# Patient Record
Sex: Male | Born: 1960
Health system: Southern US, Community
[De-identification: ages and names within clinical notes are randomized; demographics above are authoritative.]

## PROBLEM LIST (undated history)

## (undated) DIAGNOSIS — N189 Chronic kidney disease, unspecified: Secondary | ICD-10-CM

## (undated) DIAGNOSIS — T7840XA Allergy, unspecified, initial encounter: Secondary | ICD-10-CM

## (undated) DIAGNOSIS — N529 Male erectile dysfunction, unspecified: Secondary | ICD-10-CM

## (undated) DIAGNOSIS — E785 Hyperlipidemia, unspecified: Secondary | ICD-10-CM

## (undated) DIAGNOSIS — F909 Attention-deficit hyperactivity disorder, unspecified type: Secondary | ICD-10-CM

## (undated) HISTORY — DX: Hyperlipidemia, unspecified: E78.5

## (undated) HISTORY — DX: Allergy, unspecified, initial encounter: T78.40XA

## (undated) HISTORY — DX: Chronic kidney disease, unspecified: N18.9

## (undated) HISTORY — PX: KIDNEY STONE SURGERY: SHX686

## (undated) HISTORY — DX: Male erectile dysfunction, unspecified: N52.9

## (undated) HISTORY — DX: Attention-deficit hyperactivity disorder, unspecified type: F90.9

---

## 2004-09-26 ENCOUNTER — Ambulatory Visit: Payer: Self-pay | Admitting: Family Medicine

## 2004-12-25 ENCOUNTER — Ambulatory Visit: Payer: Self-pay | Admitting: Family Medicine

## 2005-01-13 ENCOUNTER — Ambulatory Visit: Payer: Self-pay | Admitting: Licensed Clinical Social Worker

## 2005-01-20 ENCOUNTER — Ambulatory Visit: Payer: Self-pay | Admitting: Licensed Clinical Social Worker

## 2005-01-29 ENCOUNTER — Ambulatory Visit: Payer: Self-pay | Admitting: Licensed Clinical Social Worker

## 2005-02-11 ENCOUNTER — Ambulatory Visit: Payer: Self-pay | Admitting: Licensed Clinical Social Worker

## 2005-02-18 ENCOUNTER — Ambulatory Visit: Payer: Self-pay | Admitting: Licensed Clinical Social Worker

## 2005-02-24 ENCOUNTER — Ambulatory Visit: Payer: Self-pay | Admitting: Licensed Clinical Social Worker

## 2005-02-26 ENCOUNTER — Ambulatory Visit: Payer: Self-pay | Admitting: Licensed Clinical Social Worker

## 2005-03-26 ENCOUNTER — Ambulatory Visit: Payer: Self-pay | Admitting: Licensed Clinical Social Worker

## 2005-04-02 ENCOUNTER — Ambulatory Visit: Payer: Self-pay | Admitting: Licensed Clinical Social Worker

## 2005-04-15 ENCOUNTER — Ambulatory Visit: Payer: Self-pay | Admitting: Licensed Clinical Social Worker

## 2005-04-21 ENCOUNTER — Ambulatory Visit: Payer: Self-pay | Admitting: Licensed Clinical Social Worker

## 2005-04-28 ENCOUNTER — Ambulatory Visit: Payer: Self-pay | Admitting: Licensed Clinical Social Worker

## 2005-05-05 ENCOUNTER — Ambulatory Visit: Payer: Self-pay | Admitting: Licensed Clinical Social Worker

## 2005-05-12 ENCOUNTER — Ambulatory Visit: Payer: Self-pay | Admitting: Licensed Clinical Social Worker

## 2005-05-19 ENCOUNTER — Ambulatory Visit: Payer: Self-pay | Admitting: Licensed Clinical Social Worker

## 2005-09-17 ENCOUNTER — Ambulatory Visit: Payer: Self-pay | Admitting: Family Medicine

## 2005-09-22 ENCOUNTER — Ambulatory Visit: Payer: Self-pay | Admitting: Family Medicine

## 2006-05-14 ENCOUNTER — Ambulatory Visit: Payer: Self-pay | Admitting: Internal Medicine

## 2006-09-22 ENCOUNTER — Ambulatory Visit: Payer: Self-pay | Admitting: Family Medicine

## 2006-09-22 LAB — CONVERTED CEMR LAB
ALT: 20 units/L (ref 0–40)
Basophils Absolute: 0 10*3/uL (ref 0.0–0.1)
CO2: 26 meq/L (ref 19–32)
Chloride: 110 meq/L (ref 96–112)
Chol/HDL Ratio, serum: 3.5
Hemoglobin: 14.5 g/dL (ref 13.0–17.0)
LDL Cholesterol: 97 mg/dL (ref 0–99)
MCV: 89.2 fL (ref 78.0–100.0)
Neutrophils Relative %: 69.1 % (ref 43.0–77.0)
Potassium: 3.8 meq/L (ref 3.5–5.1)
RDW: 14.9 % — ABNORMAL HIGH (ref 11.5–14.6)
TSH: 1.4 microintl units/mL (ref 0.35–5.50)
Total Bilirubin: 0.9 mg/dL (ref 0.3–1.2)
Total Protein: 6.9 g/dL (ref 6.0–8.3)
Triglyceride fasting, serum: 46 mg/dL (ref 0–149)
VLDL: 9 mg/dL (ref 0–40)

## 2006-09-28 ENCOUNTER — Ambulatory Visit: Payer: Self-pay | Admitting: Family Medicine

## 2006-11-03 ENCOUNTER — Ambulatory Visit: Payer: Self-pay | Admitting: Critical Care Medicine

## 2006-11-03 ENCOUNTER — Ambulatory Visit: Admission: RE | Admit: 2006-11-03 | Discharge: 2006-11-03 | Payer: Self-pay | Admitting: Critical Care Medicine

## 2006-11-04 ENCOUNTER — Ambulatory Visit (HOSPITAL_COMMUNITY): Admission: RE | Admit: 2006-11-04 | Discharge: 2006-11-04 | Payer: Self-pay | Admitting: Critical Care Medicine

## 2006-11-05 ENCOUNTER — Ambulatory Visit: Payer: Self-pay | Admitting: Internal Medicine

## 2006-12-03 ENCOUNTER — Ambulatory Visit: Payer: Self-pay | Admitting: Critical Care Medicine

## 2007-01-28 ENCOUNTER — Ambulatory Visit: Payer: Self-pay | Admitting: Critical Care Medicine

## 2007-03-28 ENCOUNTER — Ambulatory Visit: Payer: Self-pay | Admitting: Critical Care Medicine

## 2007-04-05 ENCOUNTER — Ambulatory Visit: Payer: Self-pay | Admitting: Family Medicine

## 2007-04-27 ENCOUNTER — Ambulatory Visit: Payer: Self-pay | Admitting: Family Medicine

## 2007-05-12 ENCOUNTER — Encounter: Admission: RE | Admit: 2007-05-12 | Discharge: 2007-05-12 | Payer: Self-pay | Admitting: Occupational Medicine

## 2007-05-18 ENCOUNTER — Ambulatory Visit: Payer: Self-pay | Admitting: Family Medicine

## 2007-06-28 ENCOUNTER — Telehealth: Payer: Self-pay | Admitting: Family Medicine

## 2007-07-14 DIAGNOSIS — J309 Allergic rhinitis, unspecified: Secondary | ICD-10-CM

## 2007-09-01 ENCOUNTER — Telehealth: Payer: Self-pay | Admitting: Family Medicine

## 2007-09-23 ENCOUNTER — Ambulatory Visit: Payer: Self-pay | Admitting: Family Medicine

## 2007-09-23 DIAGNOSIS — I10 Essential (primary) hypertension: Secondary | ICD-10-CM

## 2007-09-23 DIAGNOSIS — Z87442 Personal history of urinary calculi: Secondary | ICD-10-CM | POA: Insufficient documentation

## 2007-09-26 ENCOUNTER — Encounter: Payer: Self-pay | Admitting: Family Medicine

## 2007-10-07 DIAGNOSIS — J45909 Unspecified asthma, uncomplicated: Secondary | ICD-10-CM | POA: Insufficient documentation

## 2007-12-25 IMAGING — CT CT CHEST W/ CM
2 of 3 series · 16 of 36 positions shown, 20 images · non-contrast
Comparison: None

CLINICAL DATA: Elevated right hemidiaphragm
TECHNIQUE: Multidetector CT imaging of the chest was performed following the
standard protocol during bolus administration of intravenous contrast.

[Series 2: chest_routine 5.0 b40f st · axial · 0.67mm/px · z∈[+1078,+1358]mm · 13 of 64 slices shown, 17 images]
[im 5/64  mediastinal]
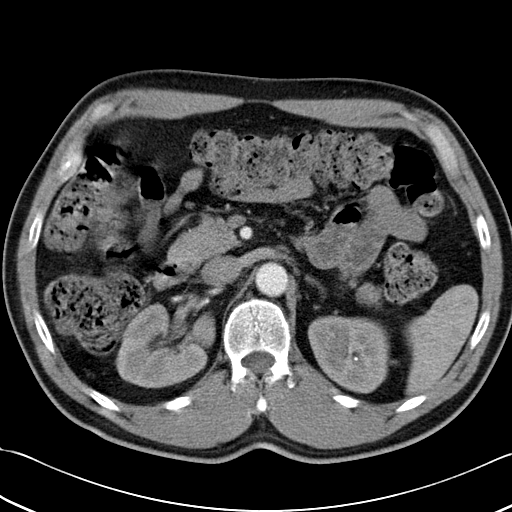
[im 5/64  lung]
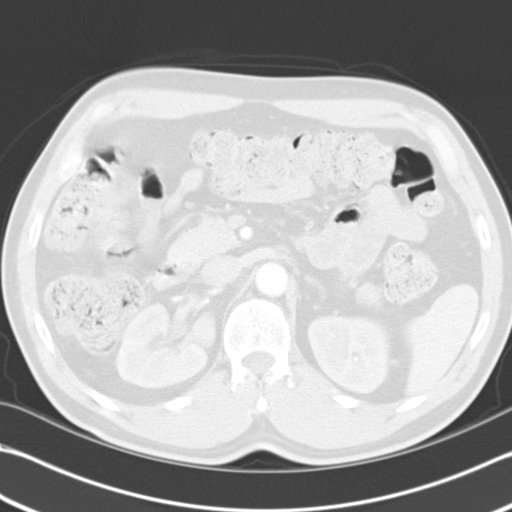
[im 10/64  lung]
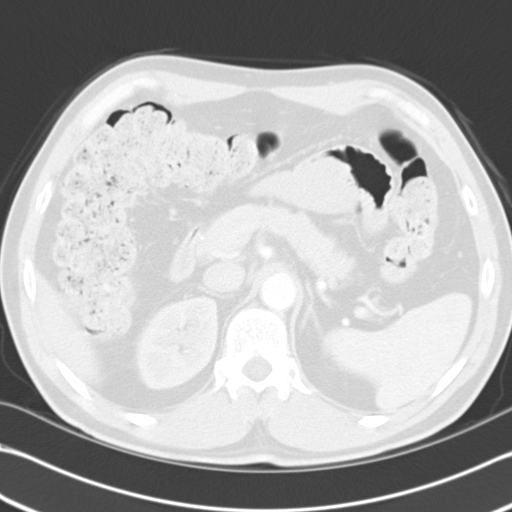
[im 15/64  lung]
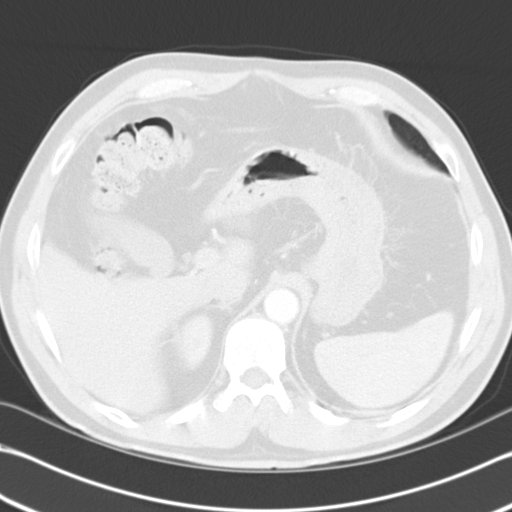
[im 19/64  lung]
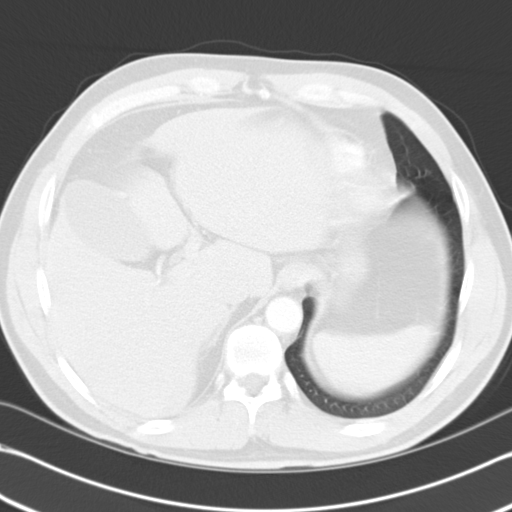
[im 24/64  mediastinal]
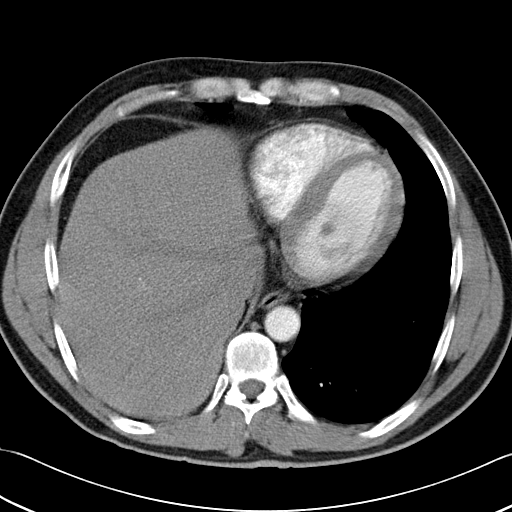
[im 24/64  lung]
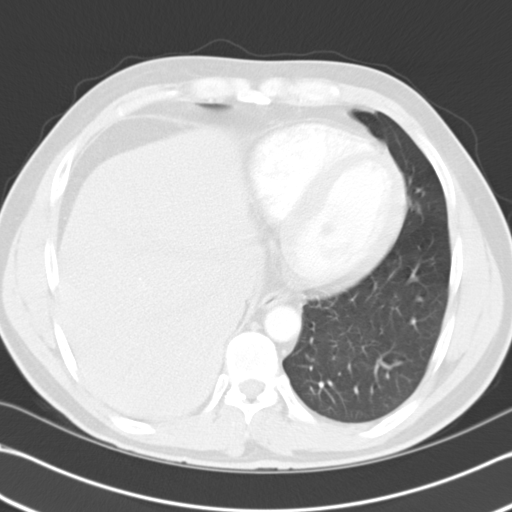
[im 29/64  lung]
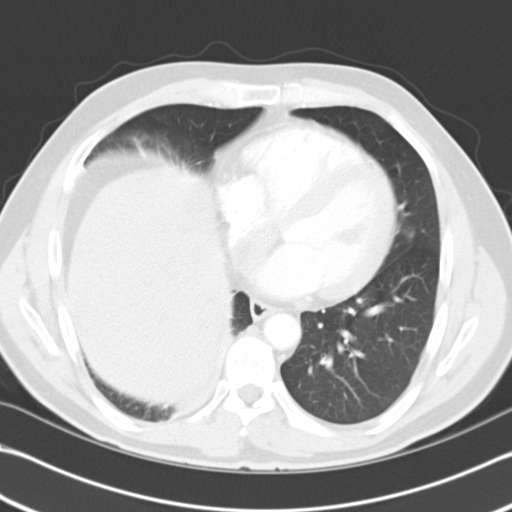
[im 33/64  lung]
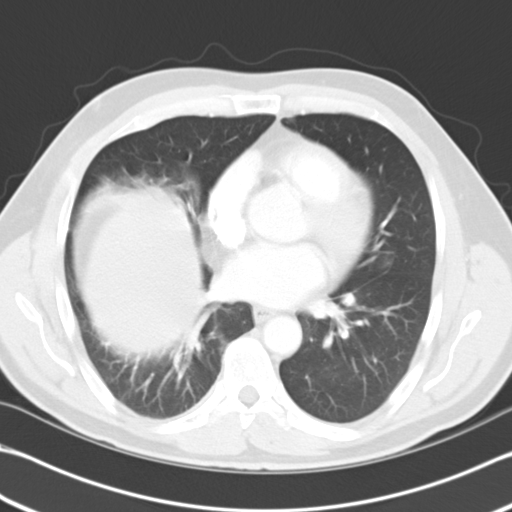
[im 38/64  lung]
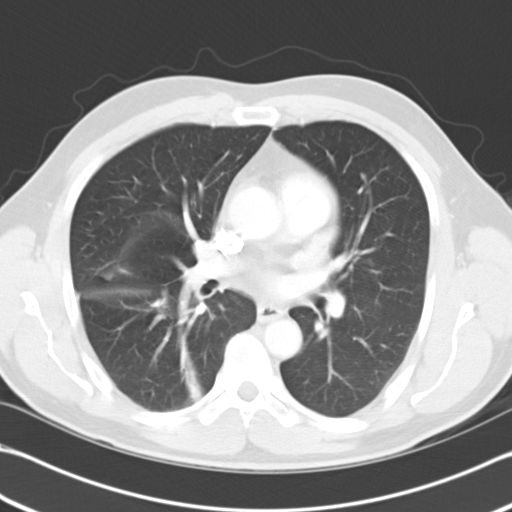
[im 43/64  mediastinal]
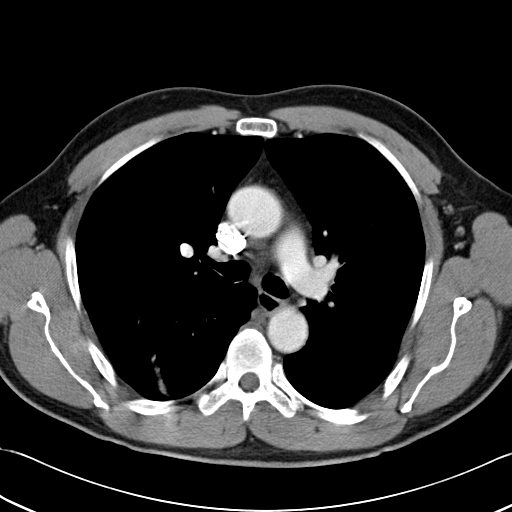
[im 43/64  lung]
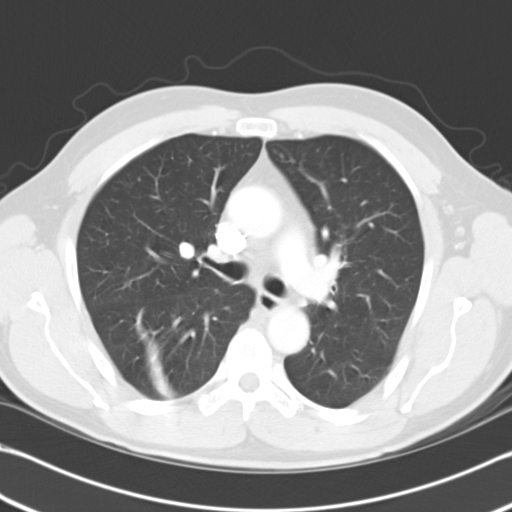
[im 47/64  lung]
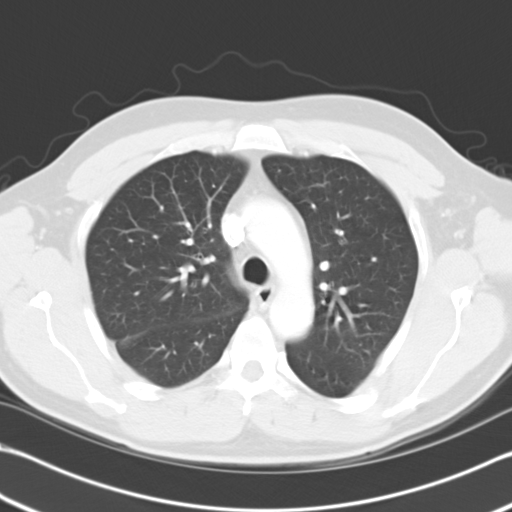
[im 52/64  lung]
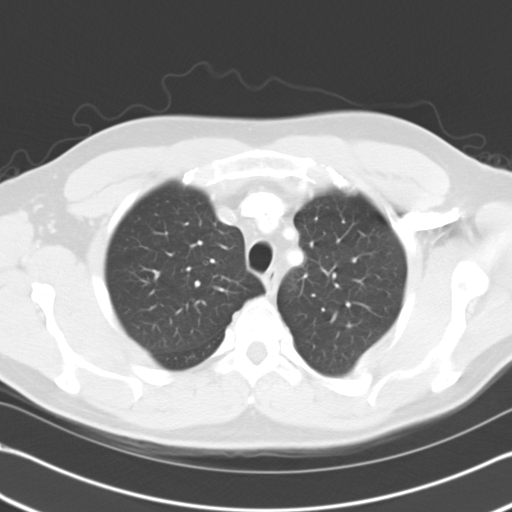
[im 57/64  lung]
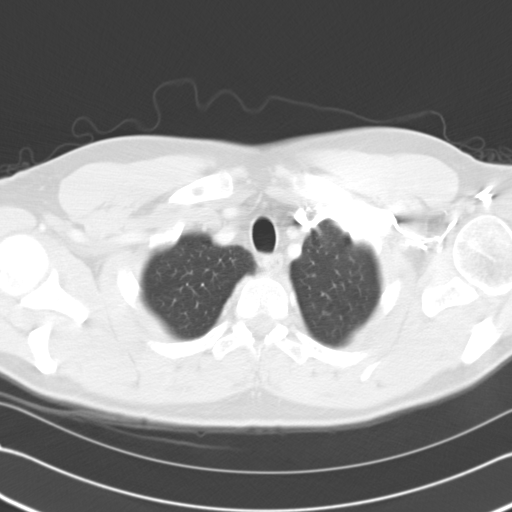
[im 61/64  mediastinal]
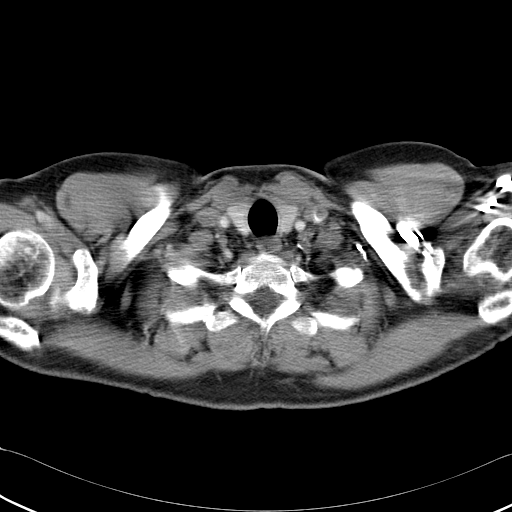
[im 61/64  lung]
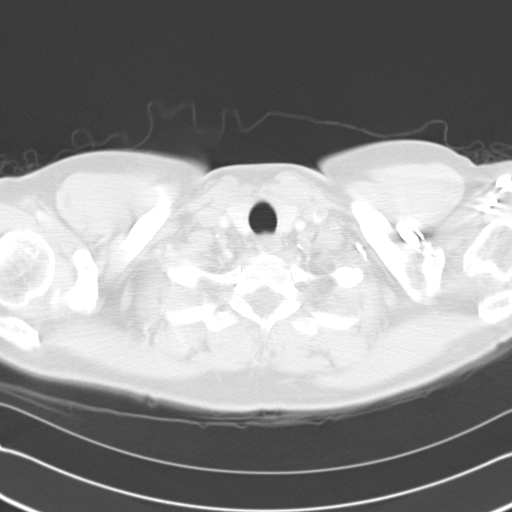

[Series 602: <mpr range> · coronal · 0.67mm/px · 3 of 114 slices shown]
[im 23/114  lung]
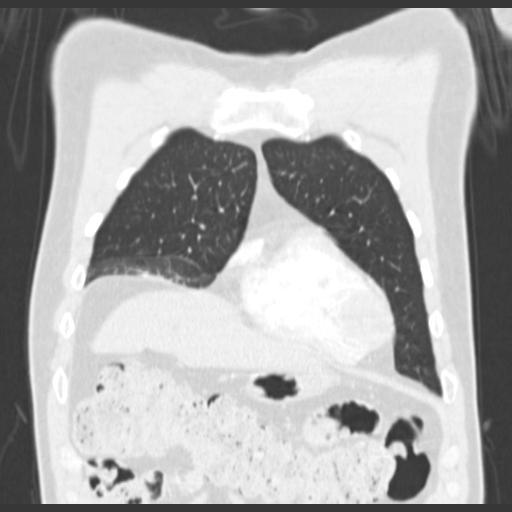
[im 46/114  lung]
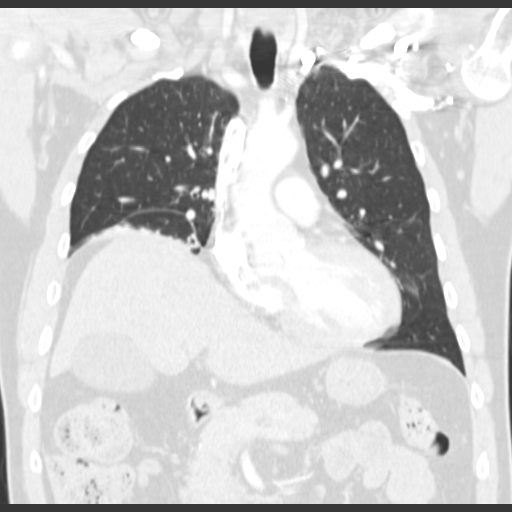
[im 68/114  lung]
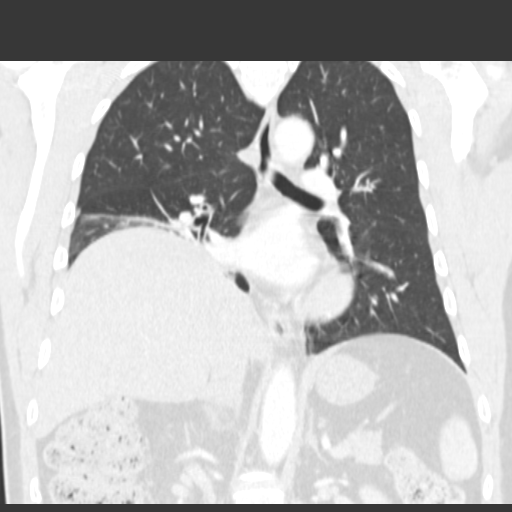

[16 of 36 positions shown; findings below may reference images not displayed]

Contrast:  80 cc Omnipaque 300

CHEST CT WITH CONTRAST:

No evidence for axillary, mediastinal, or hilar lymphadenopathy. Heart size is
normal. There is no pericardial or pleural effusion.

Asymmetric elevation of the right hemidiaphragm noted. There is adjacent
compressive atelectasis in the right lung base with subsegmental atelectasis or
linear scarring in the superior segment of the right lower lobe. Left lung is
clear.
IMPRESSION: Asymmetric elevation of the right hemidiaphragm with adjacent compressive
atelectasis.

## 2008-09-11 ENCOUNTER — Ambulatory Visit: Payer: Self-pay | Admitting: Family Medicine

## 2008-09-11 LAB — CONVERTED CEMR LAB
AST: 31 units/L (ref 0–37)
Albumin: 4.3 g/dL (ref 3.5–5.2)
Alkaline Phosphatase: 59 units/L (ref 39–117)
Bilirubin Urine: NEGATIVE
Bilirubin, Direct: 0.1 mg/dL (ref 0.0–0.3)
Blood in Urine, dipstick: NEGATIVE
CO2: 34 meq/L — ABNORMAL HIGH (ref 19–32)
Calcium: 9.7 mg/dL (ref 8.4–10.5)
Chloride: 101 meq/L (ref 96–112)
Cholesterol: 184 mg/dL (ref 0–200)
Eosinophils Absolute: 0.1 10*3/uL (ref 0.0–0.7)
GFR calc non Af Amer: 86 mL/min
Hemoglobin: 16.3 g/dL (ref 13.0–17.0)
Ketones, urine, test strip: NEGATIVE
LDL Cholesterol: 123 mg/dL — ABNORMAL HIGH (ref 0–99)
MCHC: 34.6 g/dL (ref 30.0–36.0)
Monocytes Absolute: 0.4 10*3/uL (ref 0.1–1.0)
Nitrite: POSITIVE
RBC: 5.26 M/uL (ref 4.22–5.81)
RDW: 13.7 % (ref 11.5–14.6)
Sodium: 143 meq/L (ref 135–145)
TSH: 1.01 microintl units/mL (ref 0.35–5.50)
Total Bilirubin: 0.9 mg/dL (ref 0.3–1.2)
Total CHOL/HDL Ratio: 3.6
Total Protein: 7.2 g/dL (ref 6.0–8.3)
Urobilinogen, UA: 0.2
WBC: 5.5 10*3/uL (ref 4.5–10.5)

## 2008-09-17 ENCOUNTER — Ambulatory Visit: Payer: Self-pay | Admitting: Family Medicine

## 2008-09-17 DIAGNOSIS — F319 Bipolar disorder, unspecified: Secondary | ICD-10-CM

## 2008-09-17 DIAGNOSIS — E785 Hyperlipidemia, unspecified: Secondary | ICD-10-CM

## 2008-09-18 ENCOUNTER — Encounter: Payer: Self-pay | Admitting: Family Medicine

## 2009-01-10 ENCOUNTER — Telehealth: Payer: Self-pay | Admitting: Family Medicine

## 2009-01-17 ENCOUNTER — Telehealth: Payer: Self-pay | Admitting: Family Medicine

## 2009-10-04 ENCOUNTER — Ambulatory Visit: Payer: Self-pay | Admitting: Family Medicine

## 2009-10-04 LAB — CONVERTED CEMR LAB
ALT: 27 units/L (ref 0–53)
AST: 38 units/L — ABNORMAL HIGH (ref 0–37)
Albumin: 4.2 g/dL (ref 3.5–5.2)
Alkaline Phosphatase: 72 units/L (ref 39–117)
Basophils Absolute: 0.1 10*3/uL (ref 0.0–0.1)
Bilirubin Urine: NEGATIVE
Bilirubin, Direct: 0.1 mg/dL (ref 0.0–0.3)
Calcium: 9.3 mg/dL (ref 8.4–10.5)
Creatinine, Ser: 1.1 mg/dL (ref 0.4–1.5)
Eosinophils Relative: 3.9 % (ref 0.0–5.0)
GFR calc non Af Amer: 76.36 mL/min (ref >60)
Glucose, Bld: 105 mg/dL — ABNORMAL HIGH (ref 70–99)
Hemoglobin, Urine: NEGATIVE
Hemoglobin: 15.1 g/dL (ref 13.0–17.0)
Ketones, ur: NEGATIVE mg/dL
Leukocytes, UA: NEGATIVE
Lymphs Abs: 1.4 10*3/uL (ref 0.7–4.0)
MCHC: 33.1 g/dL (ref 30.0–36.0)
Monocytes Relative: 7.7 % (ref 3.0–12.0)
Neutrophils Relative %: 64.2 % (ref 43.0–77.0)
Nitrite: NEGATIVE
Platelets: 174 10*3/uL (ref 150.0–400.0)
Sodium: 140 meq/L (ref 135–145)
TSH: 1.2 microintl units/mL (ref 0.35–5.50)
Total Bilirubin: 1.2 mg/dL (ref 0.3–1.2)
Total CHOL/HDL Ratio: 4
Total Protein, Urine: NEGATIVE mg/dL
Total Protein: 6.7 g/dL (ref 6.0–8.3)
Triglycerides: 43 mg/dL (ref 0.0–149.0)
Urine Glucose: NEGATIVE mg/dL
Urobilinogen, UA: 0.2 (ref 0.0–1.0)

## 2009-10-25 ENCOUNTER — Ambulatory Visit: Payer: Self-pay | Admitting: Family Medicine

## 2010-10-31 ENCOUNTER — Ambulatory Visit: Payer: Self-pay | Admitting: Family Medicine

## 2010-10-31 LAB — CONVERTED CEMR LAB
AST: 30 units/L (ref 0–37)
Albumin: 4.7 g/dL (ref 3.5–5.2)
Alkaline Phosphatase: 71 units/L (ref 39–117)
Basophils Absolute: 0 10*3/uL (ref 0.0–0.1)
Bilirubin, Direct: 0.2 mg/dL (ref 0.0–0.3)
CO2: 32 meq/L (ref 19–32)
Calcium: 10 mg/dL (ref 8.4–10.5)
Chloride: 101 meq/L (ref 96–112)
Creatinine, Ser: 1 mg/dL (ref 0.4–1.5)
Eosinophils Absolute: 0.1 10*3/uL (ref 0.0–0.7)
Eosinophils Relative: 2.6 % (ref 0.0–5.0)
GFR calc non Af Amer: 84.85 mL/min (ref >60.00)
Glucose, Urine, Semiquant: NEGATIVE
Ketones, urine, test strip: NEGATIVE
MCHC: 34.9 g/dL (ref 30.0–36.0)
Monocytes Absolute: 0.4 10*3/uL (ref 0.1–1.0)
Monocytes Relative: 7.3 % (ref 3.0–12.0)
Neutro Abs: 3.8 10*3/uL (ref 1.4–7.7)
Neutrophils Relative %: 68.6 % (ref 43.0–77.0)
Nitrite: NEGATIVE
Protein, U semiquant: NEGATIVE
RDW: 13.9 % (ref 11.5–14.6)
Sodium: 140 meq/L (ref 135–145)
Total Protein: 7.4 g/dL (ref 6.0–8.3)
Urobilinogen, UA: 0.2
pH: 8.5

## 2010-11-07 ENCOUNTER — Ambulatory Visit: Payer: Self-pay | Admitting: Family Medicine

## 2010-11-07 ENCOUNTER — Encounter: Payer: Self-pay | Admitting: Family Medicine

## 2010-11-07 ENCOUNTER — Telehealth: Payer: Self-pay | Admitting: Family Medicine

## 2010-12-25 NOTE — Progress Notes (Signed)
Summary: [facility] called req clarification of Sumatriptan  Phone Note From Pharmacy Call back at [phone]   [facility] DrMarland Kitchen          Indu    Summary of Call: [facility] called and said that the SUMATRIPTAN SUCCINATE 6 MG/0.5ML SOLN  is listed as an injection, but instructions are for a nasal spray. Pls clarify,  Initial call taken by: Lucy Antigua,  November 07, 2010 10:44 AM  Follow-up for Phone Call        give him a nasal spray Follow-up by: Roderick Pee MD,  November 07, 2010 11:39 AM    New/Updated Medications: SUMATRIPTAN 20 MG/ACT SOLN (SUMATRIPTAN) 1 spray on set of migraine.  May repeat after 2 hours.  do not exceed 4omg in one day.
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
## 2011-03-16 ENCOUNTER — Ambulatory Visit (INDEPENDENT_AMBULATORY_CARE_PROVIDER_SITE_OTHER): Payer: BC Managed Care – PPO | Admitting: Family Medicine

## 2011-03-16 VITALS — BP 130/90 | Temp 100.8°F | Ht 68.0 in | Wt 179.0 lb

## 2011-03-16 DIAGNOSIS — J45909 Unspecified asthma, uncomplicated: Secondary | ICD-10-CM

## 2011-03-16 MED ORDER — PREDNISONE 20 MG PO TABS: ORAL_TABLET | ORAL | Status: DC

## 2011-03-16 MED ORDER — HYDROCODONE-HOMATROPINE 5-1.5 MG/5ML PO SYRP: ORAL_SOLUTION | ORAL | Status: DC

## 2011-03-16 NOTE — Progress Notes (Signed)
  Subjective:    Patient ID: Jimmy Holmes, male    DOB: [birth year], 50 y.o.   MRN: [id number]  Jimmy Holmes is a  50 year old male, nonsmoker, who comes in today for evaluation of cough and fever.  He has had a history of allergic rhinitis and asthma and began having a congestion, postnasal drip, and cough.  Three weeks ago.  He taken OTC, Zyrtec nightly  Yesterday, he began having fever.  Temp was 101.7, chills, and muscle aches.  Review of systems otherwise negative.  His wife 3 weeks ago was diagnosed to have a two lobe pneumonia and was treated at an urgent care with Levaquin.  She still coughing    Review of Systems General and pulmonary review of systems otherwise negative    Objective:   Physical Exam    Well-developed well-nourished, male in no acute distress.  HEENT negative.  Neck was supple.  No adenopathy.  Lungs are clear except for some late expiratory wheezing bilaterally    Assessment & Plan:  Allergic rhinitis continue Zyrtec 10 nightly  Asthma.  Add prednisone burst and taper.  Viral syndrome Tylenol liquids.  Hydromet p.r.n. For cough.  Return p.r.n.

## 2011-03-16 NOTE — Patient Instructions (Signed)
Take the prednisone as directed for your asthma.  Stop the Zyrtec temporarily.  When you taper the prednisone to one tablet Monday, Wednesday, Friday, then restart the Zantac, plane, 10 mg at bedtime.  Tylenol lots of liquids.  Hydromet p.r.n. For viral syndrome

## 2011-04-07 NOTE — Assessment & Plan Note (Signed)
[facility]                             PULMONARY OFFICE NOTE   SHELLEY, POOLEY                   MRN:          [id number]  DATE:03/28/2007                            DOB:          05/22/1961    Jimmy Holmes is a 50 year old white male with a history of asthmatic  bronchitis with chronic airflow obstruction. Overall, he is doing better  with decreased shortness of breath with exertion. He is not coughing. He  is now on the Symbicort 160/4.5 two sprays b.i.d. He has minimal  hoarseness.   Other maintenance medications are unchanged since the last visit. He is  on now on potassium supplementation.   PHYSICAL EXAMINATION:  Temperature 98.0, blood pressure 154/98, pulse  81, saturation was 97% on room air.  CHEST: Showed distant breath sounds with prolonged expiratory phase. No  wheeze or rhonchi noted.  CARDIAC: Showed a regular rate and rhythm without S3. Normal S1, S2.  ABDOMEN: Soft, nontender.  EXTREMITIES: Showed no edema or clubbing.  SKIN: Was clear.   Spirometry showed improvement in moderate obstruction with an FEV1 now  at 69% predicted, FVC of 91% predicted.   PLAN:  Plan is to maintain Symbicort as is. Refills were given.     Charlcie Cradle Delford Field, MD, [facility]  Electronically Signed    PEW/MedQ  DD: 03/28/2007  DT: 03/28/2007  Job #: [id number]

## 2011-04-10 NOTE — Assessment & Plan Note (Signed)
[facility]                             PULMONARY OFFICE NOTE   Jimmy Holmes, Jimmy Holmes                   MRN:          [id number]  DATE:01/28/2007                            DOB:          December 18, 1960    Jimmy Holmes is a 50 year old white male with asthmatic bronchitis,  still having some dyspnea with heavy exertion, although is not coughing.  He is on the Symbicort at 80/4.5 one spray twice daily.  Still  symptomatic.  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   PHYSICAL EXAMINATION:  Temp 98, blood pressure 154/98, pulse 81,  saturation 97% on room air.  CHEST:  Distant breath sounds with prolonged expiratory phase.  No  wheeze or rhonchi were noted.  CARDIAC EXAM:  Showed a regular rate and rhythm without S3, normal S1,  S2.  ABDOMEN:  Soft, nontender.  EXTREMITIES:  Showed no edema, clubbing, or venous disease.  SKIN:  Clear.  NEUROLOGIC EXAM:  Intact.   Pulmonary function studies were obtained and showed an FEV1 of 64%  predicted, FEC of 90% predicted.  These values were compatible with  continued airflow obstruction.   IMPRESSION:  Asthmatic bronchitis with poor airflow function.   PLAN:  Increase Symbicort to 160/4.5 two sprays b.i.d.  Samples were  given.  Patient was re instructed as to proper use.  We will see this  patient back in return followup in 1 month to reassess.     Charlcie Cradle Delford Field, MD, [facility]  Electronically Signed    PEW/MedQ  DD: 01/28/2007  DT: 01/29/2007  Job #: [id number]
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
## 2011-11-06 ENCOUNTER — Other Ambulatory Visit (INDEPENDENT_AMBULATORY_CARE_PROVIDER_SITE_OTHER): Payer: BC Managed Care – PPO

## 2011-11-06 DIAGNOSIS — Z0000 Encounter for general adult medical examination without abnormal findings: Secondary | ICD-10-CM

## 2011-11-06 LAB — HEPATIC FUNCTION PANEL
ALT: 27 U/L (ref 0–53)
AST: 25 U/L (ref 0–37)
Albumin: 3.8 g/dL (ref 3.5–5.2)
Alkaline Phosphatase: 53 U/L (ref 39–117)
Total Bilirubin: 0.7 mg/dL (ref 0.3–1.2)
Total Protein: 6.4 g/dL (ref 6.0–8.3)

## 2011-11-06 LAB — CBC WITH DIFFERENTIAL/PLATELET
Basophils Absolute: 0 K/uL (ref 0.0–0.1)
Basophils Relative: 0.6 % (ref 0.0–3.0)
Eosinophils Absolute: 0.2 K/uL (ref 0.0–0.7)
Eosinophils Relative: 3.3 % (ref 0.0–5.0)
HCT: 42.8 % (ref 39.0–52.0)
Hemoglobin: 14.6 g/dL (ref 13.0–17.0)
Lymphocytes Relative: 22.6 % (ref 12.0–46.0)
Lymphs Abs: 1.2 K/uL (ref 0.7–4.0)
MCHC: 34 g/dL (ref 30.0–36.0)
MCV: 95.7 fl (ref 78.0–100.0)
Monocytes Absolute: 0.5 K/uL (ref 0.1–1.0)
Monocytes Relative: 8.6 % (ref 3.0–12.0)
Neutro Abs: 3.5 K/uL (ref 1.4–7.7)
Neutrophils Relative %: 64.9 % (ref 43.0–77.0)
Platelets: 176 K/uL (ref 150.0–400.0)
RBC: 4.47 Mil/uL (ref 4.22–5.81)
RDW: 13.5 % (ref 11.5–14.6)
WBC: 5.4 K/uL (ref 4.5–10.5)

## 2011-11-06 LAB — LIPID PANEL
Cholesterol: 169 mg/dL (ref 0–200)
HDL: 57.4 mg/dL
LDL Cholesterol: 105 mg/dL — ABNORMAL HIGH (ref 0–99)
Total CHOL/HDL Ratio: 3
Triglycerides: 33 mg/dL (ref 0.0–149.0)
VLDL: 6.6 mg/dL (ref 0.0–40.0)

## 2011-11-06 LAB — POCT URINALYSIS DIPSTICK
Bilirubin, UA: NEGATIVE
Blood, UA: NEGATIVE
Glucose, UA: NEGATIVE
Ketones, UA: NEGATIVE
Leukocytes, UA: NEGATIVE
Nitrite, UA: NEGATIVE
Protein, UA: NEGATIVE
Spec Grav, UA: 1.02
Urobilinogen, UA: 0.2
pH, UA: 7

## 2011-11-06 LAB — BASIC METABOLIC PANEL WITH GFR
BUN: 20 mg/dL (ref 6–23)
CO2: 31 meq/L (ref 19–32)
Calcium: 8.9 mg/dL (ref 8.4–10.5)
Chloride: 106 meq/L (ref 96–112)
Creatinine, Ser: 0.9 mg/dL (ref 0.4–1.5)
GFR: 99.22 mL/min
Glucose, Bld: 117 mg/dL — ABNORMAL HIGH (ref 70–99)
Potassium: 3.3 meq/L — ABNORMAL LOW (ref 3.5–5.1)
Sodium: 143 meq/L (ref 135–145)

## 2011-11-06 LAB — TSH: TSH: 1.11 u[IU]/mL (ref 0.35–5.50)

## 2011-11-11 ENCOUNTER — Ambulatory Visit (INDEPENDENT_AMBULATORY_CARE_PROVIDER_SITE_OTHER): Payer: BC Managed Care – PPO | Admitting: Family Medicine

## 2011-11-11 ENCOUNTER — Encounter: Payer: Self-pay | Admitting: Family Medicine

## 2011-11-11 DIAGNOSIS — E785 Hyperlipidemia, unspecified: Secondary | ICD-10-CM

## 2011-11-11 DIAGNOSIS — IMO0002 Reserved for concepts with insufficient information to code with codable children: Secondary | ICD-10-CM

## 2011-11-11 DIAGNOSIS — J309 Allergic rhinitis, unspecified: Secondary | ICD-10-CM

## 2011-11-11 DIAGNOSIS — F319 Bipolar disorder, unspecified: Secondary | ICD-10-CM

## 2011-11-11 DIAGNOSIS — Z87442 Personal history of urinary calculi: Secondary | ICD-10-CM

## 2011-11-11 DIAGNOSIS — N529 Male erectile dysfunction, unspecified: Secondary | ICD-10-CM | POA: Insufficient documentation

## 2011-11-11 DIAGNOSIS — Z820 Family history of epilepsy and other diseases of the nervous system: Secondary | ICD-10-CM

## 2011-11-11 MED ORDER — SILDENAFIL CITRATE 100 MG PO TABS: 100.0000 mg | ORAL_TABLET | Freq: Every day | ORAL | Status: DC | PRN

## 2011-11-11 MED ORDER — TOPIRAMATE 100 MG PO TABS: 100.0000 mg | ORAL_TABLET | Freq: Every day | ORAL | Status: DC

## 2011-11-11 MED ORDER — DULOXETINE HCL 60 MG PO CPEP: 60.0000 mg | ORAL_CAPSULE | Freq: Every day | ORAL | Status: DC

## 2011-11-11 MED ORDER — SIMVASTATIN 10 MG PO TABS: 10.0000 mg | ORAL_TABLET | Freq: Every day | ORAL | Status: DC

## 2011-11-11 MED ORDER — SUMATRIPTAN 20 MG/ACT NA SOLN: 1.0000 | NASAL | Status: DC | PRN

## 2011-11-11 MED ORDER — CHLORTHALIDONE 25 MG PO TABS: 25.0000 mg | ORAL_TABLET | Freq: Every day | ORAL | Status: DC

## 2011-11-11 MED ORDER — LAMOTRIGINE 100 MG PO TABS: 100.0000 mg | ORAL_TABLET | Freq: Every day | ORAL | Status: DC

## 2011-11-11 MED ORDER — POTASSIUM CITRATE ER 10 MEQ (1080 MG) PO TBCR: EXTENDED_RELEASE_TABLET | ORAL | Status: DC

## 2011-11-11 NOTE — Patient Instructions (Signed)
Continued y  current medications.  Follow-up in one year or sooner if any problems
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
## 2012-03-28 ENCOUNTER — Telehealth: Payer: Self-pay | Admitting: Family Medicine

## 2012-03-28 DIAGNOSIS — Z87442 Personal history of urinary calculi: Secondary | ICD-10-CM

## 2012-03-28 MED ORDER — POTASSIUM CITRATE ER 10 MEQ (1080 MG) PO TBCR: EXTENDED_RELEASE_TABLET | ORAL | Status: DC

## 2012-03-28 NOTE — Telephone Encounter (Signed)
Pts wife called and said that script for potassium citrate (UROCIT-K) 10 MEQ (1080 MG) SR tablet needs to be re-written as to take 3 in a.m and 2 in pm. Pls call in to [facility] on [address] in [place] [phone]

## 2012-09-23 ENCOUNTER — Other Ambulatory Visit (INDEPENDENT_AMBULATORY_CARE_PROVIDER_SITE_OTHER): Payer: No Typology Code available for payment source

## 2012-09-23 DIAGNOSIS — Z0000 Encounter for general adult medical examination without abnormal findings: Secondary | ICD-10-CM

## 2012-09-23 LAB — LIPID PANEL
HDL: 39.1 mg/dL (ref >39.00)
Total CHOL/HDL Ratio: 4
VLDL: 9.6 mg/dL (ref 0.0–40.0)

## 2012-09-23 LAB — BASIC METABOLIC PANEL
BUN: 25 mg/dL — ABNORMAL HIGH (ref 6–23)
Calcium: 9.5 mg/dL (ref 8.4–10.5)
Creatinine, Ser: 1.1 mg/dL (ref 0.4–1.5)
Potassium: 3.5 mEq/L (ref 3.5–5.1)

## 2012-09-23 LAB — HEPATIC FUNCTION PANEL
Alkaline Phosphatase: 70 U/L (ref 39–117)
Bilirubin, Direct: 0.1 mg/dL (ref 0.0–0.3)
Total Bilirubin: 0.7 mg/dL (ref 0.3–1.2)
Total Protein: 7 g/dL (ref 6.0–8.3)

## 2012-09-23 LAB — POCT URINALYSIS DIPSTICK
Blood, UA: NEGATIVE
Leukocytes, UA: NEGATIVE
Nitrite, UA: NEGATIVE
Spec Grav, UA: 1.015
pH, UA: 8.5

## 2012-09-23 LAB — CBC WITH DIFFERENTIAL/PLATELET
Basophils Absolute: 0 10*3/uL (ref 0.0–0.1)
Eosinophils Absolute: 0.3 10*3/uL (ref 0.0–0.7)
Eosinophils Relative: 4.1 % (ref 0.0–5.0)
HCT: 48.9 % (ref 39.0–52.0)
Hemoglobin: 16.7 g/dL (ref 13.0–17.0)
MCHC: 34.2 g/dL (ref 30.0–36.0)
MCV: 92.4 fl (ref 78.0–100.0)
Monocytes Absolute: 0.6 10*3/uL (ref 0.1–1.0)
Neutrophils Relative %: 64.2 % (ref 43.0–77.0)
RDW: 13.3 % (ref 11.5–14.6)

## 2012-09-23 LAB — PSA: PSA: 0.47 ng/mL (ref 0.10–4.00)

## 2012-09-23 LAB — TSH: TSH: 1.32 u[IU]/mL (ref 0.35–5.50)

## 2012-09-29 ENCOUNTER — Ambulatory Visit (INDEPENDENT_AMBULATORY_CARE_PROVIDER_SITE_OTHER): Payer: No Typology Code available for payment source | Admitting: Family Medicine

## 2012-09-29 ENCOUNTER — Encounter: Payer: Self-pay | Admitting: Family Medicine

## 2012-09-29 VITALS — BP 140/100 | Temp 98.4°F | Ht 69.0 in | Wt 193.0 lb

## 2012-09-29 DIAGNOSIS — J309 Allergic rhinitis, unspecified: Secondary | ICD-10-CM

## 2012-09-29 DIAGNOSIS — E785 Hyperlipidemia, unspecified: Secondary | ICD-10-CM

## 2012-09-29 DIAGNOSIS — Z8669 Personal history of other diseases of the nervous system and sense organs: Secondary | ICD-10-CM

## 2012-09-29 DIAGNOSIS — Z820 Family history of epilepsy and other diseases of the nervous system: Secondary | ICD-10-CM

## 2012-09-29 DIAGNOSIS — Z0000 Encounter for general adult medical examination without abnormal findings: Secondary | ICD-10-CM

## 2012-09-29 DIAGNOSIS — IMO0002 Reserved for concepts with insufficient information to code with codable children: Secondary | ICD-10-CM

## 2012-09-29 DIAGNOSIS — N529 Male erectile dysfunction, unspecified: Secondary | ICD-10-CM

## 2012-09-29 DIAGNOSIS — F319 Bipolar disorder, unspecified: Secondary | ICD-10-CM

## 2012-09-29 DIAGNOSIS — Z87442 Personal history of urinary calculi: Secondary | ICD-10-CM

## 2012-09-29 MED ORDER — SIMVASTATIN 10 MG PO TABS: 10.0000 mg | ORAL_TABLET | Freq: Every day | ORAL | Status: DC

## 2012-09-29 MED ORDER — CHLORTHALIDONE 25 MG PO TABS: 25.0000 mg | ORAL_TABLET | Freq: Every day | ORAL | Status: DC

## 2012-09-29 MED ORDER — SUMATRIPTAN 20 MG/ACT NA SOLN: 1.0000 | NASAL | Status: DC | PRN

## 2012-09-29 MED ORDER — POTASSIUM CITRATE ER 10 MEQ (1080 MG) PO TBCR: EXTENDED_RELEASE_TABLET | ORAL | Status: DC

## 2012-09-29 MED ORDER — SILDENAFIL CITRATE 100 MG PO TABS: 100.0000 mg | ORAL_TABLET | Freq: Every day | ORAL | Status: DC | PRN

## 2012-09-29 MED ORDER — LAMOTRIGINE 100 MG PO TABS: 100.0000 mg | ORAL_TABLET | Freq: Every day | ORAL | Status: DC

## 2012-09-29 MED ORDER — TOPIRAMATE 100 MG PO TABS: 100.0000 mg | ORAL_TABLET | Freq: Every day | ORAL | Status: DC

## 2012-09-29 NOTE — Patient Instructions (Signed)
Continue your current medications  Followup in 1 year sooner if any problems 

## 2012-09-29 NOTE — Progress Notes (Signed)
  Subjective:    Patient ID: Jimmy Holmes, male    DOB: 01-May-1961, 51 y.o.   MRN: [id number]  HPI Alekxander Is a 51 year old married male nonsmoker who comes in today for general physical examination because of a history of allergic rhinitis, mild hypertension, bipolar depression, erectile dysfunction, hyperlipidemia, and migraine headaches  His medications reviewed the been no changes except he stopped the Cymbalta in the spring and feels clinically well. His wife wants him to continue the Lamictal.  He gets routine eye care, dental care, tetanus 2007, declines a flu shot   Review of Systems  Constitutional: Negative.   HENT: Negative.   Eyes: Negative.   Respiratory: Negative.   Cardiovascular: Negative.   Gastrointestinal: Negative.   Genitourinary: Negative.   Musculoskeletal: Negative.   Skin: Negative.   Neurological: Negative.   Hematological: Negative.   Psychiatric/Behavioral: Negative.        Objective:   Physical Exam  Constitutional: He is oriented to person, place, and time. He appears well-developed and well-nourished.  HENT:  Head: Normocephalic and atraumatic.  Right Ear: External ear normal.  Left Ear: External ear normal.  Nose: Nose normal.  Mouth/Throat: Oropharynx is clear and moist.  Eyes: Conjunctivae normal and EOM are normal. Pupils are equal, round, and reactive to light.  Neck: Normal range of motion. Neck supple. No JVD present. No tracheal deviation present. No thyromegaly present.  Cardiovascular: Normal rate, regular rhythm, normal heart sounds and intact distal pulses.  Exam reveals no gallop and no friction rub.   No murmur heard. Pulmonary/Chest: Effort normal and breath sounds normal. No stridor. No respiratory distress. He has no wheezes. He has no rales. He exhibits no tenderness.  Abdominal: Soft. Bowel sounds are normal. He exhibits no distension and no mass. There is no tenderness. There is no rebound and no guarding.    Genitourinary: Rectum normal, prostate normal and penis normal. Guaiac negative stool. No penile tenderness.  Musculoskeletal: Normal range of motion. He exhibits no edema and no tenderness.  Lymphadenopathy:    He has no cervical adenopathy.  Neurological: He is alert and oriented to person, place, and time. He has normal reflexes. No cranial nerve deficit. He exhibits normal muscle tone.  Skin: Skin is warm and dry. No rash noted. No erythema. No pallor.  Psychiatric: He has a normal mood and affect. His behavior is normal. Judgment and thought content normal.     where    Assessment & Plan:   Allergic rhinitis continue Zyrtec  Hypertension continue hydrochlorothiazide one daily healthy male healthy male healthy male in for  Healthy male  Allergic rhinitis continue Zyrtec  Hypertension continue chlorthalidone 25 mg daily  History of bipolar depression continue Lamictal 100 mg daily  Erectile dysfunction continue Viagra  Hyperlipidemia continue Zocor  Migraine headaches continue Topamax  Followup in 1 year sooner if any problems

## 2013-10-15 ENCOUNTER — Other Ambulatory Visit: Payer: Self-pay | Admitting: Family Medicine

## 2013-10-22 ENCOUNTER — Other Ambulatory Visit: Payer: Self-pay | Admitting: Family Medicine

## 2013-11-02 ENCOUNTER — Other Ambulatory Visit: Payer: No Typology Code available for payment source

## 2013-11-03 ENCOUNTER — Other Ambulatory Visit (INDEPENDENT_AMBULATORY_CARE_PROVIDER_SITE_OTHER): Payer: BC Managed Care – PPO

## 2013-11-03 DIAGNOSIS — Z0000 Encounter for general adult medical examination without abnormal findings: Secondary | ICD-10-CM

## 2013-11-03 LAB — HEPATIC FUNCTION PANEL
AST: 27 U/L (ref 0–37)
Albumin: 4.6 g/dL (ref 3.5–5.2)
Alkaline Phosphatase: 62 U/L (ref 39–117)
Total Bilirubin: 1.3 mg/dL — ABNORMAL HIGH (ref 0.3–1.2)
Total Protein: 7.2 g/dL (ref 6.0–8.3)

## 2013-11-03 LAB — POCT URINALYSIS DIPSTICK
Blood, UA: NEGATIVE
Leukocytes, UA: NEGATIVE
Nitrite, UA: NEGATIVE
Urobilinogen, UA: 1
pH, UA: 7.5

## 2013-11-03 LAB — BASIC METABOLIC PANEL
CO2: 32 mEq/L (ref 19–32)
Calcium: 9.9 mg/dL (ref 8.4–10.5)
Chloride: 101 mEq/L (ref 96–112)
Creatinine, Ser: 1.1 mg/dL (ref 0.4–1.5)
GFR: 72.78 mL/min (ref >60.00)
Glucose, Bld: 76 mg/dL (ref 70–99)

## 2013-11-03 LAB — CBC WITH DIFFERENTIAL/PLATELET
Basophils Absolute: 0 10*3/uL (ref 0.0–0.1)
Basophils Relative: 0.6 % (ref 0.0–3.0)
Eosinophils Absolute: 0.1 10*3/uL (ref 0.0–0.7)
Hemoglobin: 15.8 g/dL (ref 13.0–17.0)
Lymphocytes Relative: 21.4 % (ref 12.0–46.0)
MCHC: 34.2 g/dL (ref 30.0–36.0)
MCV: 90.8 fl (ref 78.0–100.0)
Monocytes Absolute: 0.5 10*3/uL (ref 0.1–1.0)
Neutro Abs: 5.6 10*3/uL (ref 1.4–7.7)
Neutrophils Relative %: 70 % (ref 43.0–77.0)
Platelets: 184 10*3/uL (ref 150.0–400.0)
RBC: 5.1 Mil/uL (ref 4.22–5.81)
RDW: 13.6 % (ref 11.5–14.6)
WBC: 8 10*3/uL (ref 4.5–10.5)

## 2013-11-03 LAB — LIPID PANEL: VLDL: 6.8 mg/dL (ref 0.0–40.0)

## 2013-11-03 LAB — PSA: PSA: 0.52 ng/mL (ref 0.10–4.00)

## 2013-11-09 ENCOUNTER — Ambulatory Visit (INDEPENDENT_AMBULATORY_CARE_PROVIDER_SITE_OTHER): Payer: BC Managed Care – PPO | Admitting: Family Medicine

## 2013-11-09 ENCOUNTER — Encounter: Payer: Self-pay | Admitting: Family Medicine

## 2013-11-09 VITALS — BP 120/80 | Temp 98.6°F | Ht 67.5 in | Wt 185.0 lb

## 2013-11-09 DIAGNOSIS — E785 Hyperlipidemia, unspecified: Secondary | ICD-10-CM

## 2013-11-09 DIAGNOSIS — Z87442 Personal history of urinary calculi: Secondary | ICD-10-CM

## 2013-11-09 DIAGNOSIS — J309 Allergic rhinitis, unspecified: Secondary | ICD-10-CM

## 2013-11-09 DIAGNOSIS — Z820 Family history of epilepsy and other diseases of the nervous system: Secondary | ICD-10-CM

## 2013-11-09 DIAGNOSIS — F319 Bipolar disorder, unspecified: Secondary | ICD-10-CM

## 2013-11-09 DIAGNOSIS — N529 Male erectile dysfunction, unspecified: Secondary | ICD-10-CM

## 2013-11-09 DIAGNOSIS — IMO0002 Reserved for concepts with insufficient information to code with codable children: Secondary | ICD-10-CM

## 2013-11-09 DIAGNOSIS — Z8669 Personal history of other diseases of the nervous system and sense organs: Secondary | ICD-10-CM

## 2013-11-09 MED ORDER — POTASSIUM CITRATE ER 10 MEQ (1080 MG) PO TBCR: EXTENDED_RELEASE_TABLET | ORAL | Status: DC

## 2013-11-09 MED ORDER — SILDENAFIL CITRATE 100 MG PO TABS: 100.0000 mg | ORAL_TABLET | Freq: Every day | ORAL | Status: DC | PRN

## 2013-11-09 MED ORDER — TOPIRAMATE 100 MG PO TABS: 100.0000 mg | ORAL_TABLET | Freq: Every day | ORAL | Status: DC

## 2013-11-09 MED ORDER — CHLORTHALIDONE 25 MG PO TABS: ORAL_TABLET | ORAL | Status: DC

## 2013-11-09 MED ORDER — SUMATRIPTAN 20 MG/ACT NA SOLN: 1.0000 | NASAL | Status: DC | PRN

## 2013-11-09 MED ORDER — LAMOTRIGINE 100 MG PO TABS: 100.0000 mg | ORAL_TABLET | Freq: Every day | ORAL | Status: DC

## 2013-11-09 MED ORDER — SIMVASTATIN 10 MG PO TABS: ORAL_TABLET | ORAL | Status: DC

## 2013-11-09 NOTE — Progress Notes (Signed)
   Subjective:    Patient ID: Jimmy Holmes, male    DOB: [birth year], 52 y.o.   MRN: [id number]  HPI Jimmy Holmes is a 52 year old married male nonsmoker who comes in today for general physical examination because of a history of allergic rhinitis, mild hypertension, bipolar depression, erectile dysfunction, hyperlipidemia, and migraine headaches  His meds reviewed in detail there've been no changes he says he feels well and has no complaints  He gets routine eye care, dental care, at age 18 due for his first colonoscopy   Review of Systems  Constitutional: Negative.   HENT: Negative.   Eyes: Negative.   Respiratory: Negative.   Cardiovascular: Negative.   Gastrointestinal: Negative.   Genitourinary: Negative.   Musculoskeletal: Negative.   Skin: Negative.   Neurological: Negative.   Psychiatric/Behavioral: Negative.        Objective:   Physical Exam  Constitutional: He is oriented to person, place, and time. He appears well-developed and well-nourished.  HENT:  Head: Normocephalic and atraumatic.  Right Ear: External ear normal.  Left Ear: External ear normal.  Nose: Nose normal.  Mouth/Throat: Oropharynx is clear and moist.  Eyes: Conjunctivae and EOM are normal. Pupils are equal, round, and reactive to light.  Neck: Normal range of motion. Neck supple. No JVD present. No tracheal deviation present. No thyromegaly present.  Cardiovascular: Normal rate, regular rhythm, normal heart sounds and intact distal pulses.  Exam reveals no gallop and no friction rub.   No murmur heard. Pulmonary/Chest: Effort normal and breath sounds normal. No stridor. No respiratory distress. He has no wheezes. He has no rales. He exhibits no tenderness.  Abdominal: Soft. Bowel sounds are normal. He exhibits no distension and no mass. There is no tenderness. There is no rebound and no guarding.  Genitourinary: Rectum normal, prostate normal and penis normal. Guaiac negative stool. No penile  tenderness.  Musculoskeletal: Normal range of motion. He exhibits no edema and no tenderness.  Lymphadenopathy:    He has no cervical adenopathy.  Neurological: He is alert and oriented to person, place, and time. He has normal reflexes. No cranial nerve deficit. He exhibits normal muscle tone.  Skin: Skin is warm and dry. No rash noted. No erythema. No pallor.  Total body skin exam normal multiple tattoos  Psychiatric: He has a normal mood and affect. His behavior is normal. Judgment and thought content normal.          Assessment & Plan:  Healthy male  Allergic rhinitis continue Zyrtec  Hypertension continue height return  Bipolar depression continue Lamictal  Erectile dysfunction continue Viagra  Hyperlipidemia continue simvastatin  Migraine headaches continue Topamax and Imitrex when necessary  Recommend screening colonoscopy

## 2013-11-09 NOTE — Patient Instructions (Signed)
Continue your current medications  [email or web address] for the Viagra  Return in one year sooner if any problems

## 2014-01-15 ENCOUNTER — Encounter: Payer: Self-pay | Admitting: Family Medicine

## 2014-11-27 ENCOUNTER — Telehealth: Payer: Self-pay | Admitting: Family Medicine

## 2014-11-27 DIAGNOSIS — I10 Essential (primary) hypertension: Secondary | ICD-10-CM

## 2014-11-27 DIAGNOSIS — E785 Hyperlipidemia, unspecified: Secondary | ICD-10-CM

## 2014-11-27 NOTE — Telephone Encounter (Signed)
Pt would like to go to [facility] for his cpx labs. Cpx schedule 01/17/15 Can you the orders in?

## 2014-11-27 NOTE — Telephone Encounter (Signed)
Labs ordered.

## 2014-12-19 ENCOUNTER — Other Ambulatory Visit: Payer: Self-pay | Admitting: Family Medicine

## 2014-12-19 DIAGNOSIS — Z87442 Personal history of urinary calculi: Secondary | ICD-10-CM

## 2014-12-19 DIAGNOSIS — F319 Bipolar disorder, unspecified: Secondary | ICD-10-CM

## 2014-12-24 MED ORDER — POTASSIUM CITRATE ER 10 MEQ (1080 MG) PO TBCR: EXTENDED_RELEASE_TABLET | ORAL | Status: DC

## 2014-12-24 MED ORDER — LAMOTRIGINE 100 MG PO TABS
100.0000 mg | ORAL_TABLET | Freq: Every day | ORAL | Status: DC
Start: 2014-12-24 — End: 2015-01-17

## 2014-12-24 NOTE — Telephone Encounter (Signed)
Pt has appt sch for 01-17-15

## 2014-12-24 NOTE — Addendum Note (Signed)
Addended by: Alfred LevinsWYRICK, CINDY D on: 12/24/2014 10:51 AM   Modules accepted: Orders

## 2014-12-24 NOTE — Telephone Encounter (Signed)
rx sent in electronically for a 30 day supply

## 2014-12-28 ENCOUNTER — Other Ambulatory Visit (INDEPENDENT_AMBULATORY_CARE_PROVIDER_SITE_OTHER): Payer: 59

## 2014-12-28 DIAGNOSIS — I10 Essential (primary) hypertension: Secondary | ICD-10-CM

## 2014-12-28 DIAGNOSIS — E785 Hyperlipidemia, unspecified: Secondary | ICD-10-CM

## 2014-12-28 LAB — HEPATIC FUNCTION PANEL
ALBUMIN: 4.8 g/dL (ref 3.5–5.2)
ALT: 25 U/L (ref 0–53)
AST: 30 U/L (ref 0–37)
Alkaline Phosphatase: 65 U/L (ref 39–117)
BILIRUBIN TOTAL: 1.1 mg/dL (ref 0.2–1.2)
Bilirubin, Direct: 0.2 mg/dL (ref 0.0–0.3)
TOTAL PROTEIN: 7.2 g/dL (ref 6.0–8.3)

## 2014-12-28 LAB — CBC WITH DIFFERENTIAL/PLATELET
Basophils Absolute: 0 10*3/uL (ref 0.0–0.1)
Basophils Relative: 0.3 % (ref 0.0–3.0)
Eosinophils Absolute: 0.1 10*3/uL (ref 0.0–0.7)
Eosinophils Relative: 1 % (ref 0.0–5.0)
HCT: 47.7 % (ref 39.0–52.0)
Hemoglobin: 16.5 g/dL (ref 13.0–17.0)
Lymphocytes Relative: 18.4 % (ref 12.0–46.0)
Lymphs Abs: 1.6 10*3/uL (ref 0.7–4.0)
MCHC: 34.5 g/dL (ref 30.0–36.0)
MCV: 90.7 fl (ref 78.0–100.0)
Monocytes Absolute: 0.5 10*3/uL (ref 0.1–1.0)
Monocytes Relative: 6.5 % (ref 3.0–12.0)
Neutro Abs: 6.2 10*3/uL (ref 1.4–7.7)
Neutrophils Relative %: 73.8 % (ref 43.0–77.0)
Platelets: 221 10*3/uL (ref 150.0–400.0)
RBC: 5.26 Mil/uL (ref 4.22–5.81)
RDW: 12.8 % (ref 11.5–15.5)
WBC: 8.4 10*3/uL (ref 4.0–10.5)

## 2014-12-28 LAB — LIPID PANEL
Cholesterol: 160 mg/dL (ref 0–200)
HDL: 57.6 mg/dL (ref >39.00)
LDL CALC: 95 mg/dL (ref 0–99)
NONHDL: 102.4
Total CHOL/HDL Ratio: 3
Triglycerides: 39 mg/dL (ref 0.0–149.0)
VLDL: 7.8 mg/dL (ref 0.0–40.0)

## 2014-12-28 LAB — BASIC METABOLIC PANEL
BUN: 19 mg/dL (ref 6–23)
CALCIUM: 10.2 mg/dL (ref 8.4–10.5)
CO2: 30 meq/L (ref 19–32)
Chloride: 100 mEq/L (ref 96–112)
Creatinine, Ser: 0.95 mg/dL (ref 0.40–1.50)
GFR: 88.51 mL/min (ref >60.00)
Glucose, Bld: 85 mg/dL (ref 70–99)
Potassium: 3.8 mEq/L (ref 3.5–5.1)
Sodium: 138 mEq/L (ref 135–145)

## 2014-12-28 LAB — PSA: PSA: 0.52 ng/mL (ref 0.10–4.00)

## 2014-12-28 LAB — URINALYSIS
BILIRUBIN URINE: NEGATIVE
HGB URINE DIPSTICK: NEGATIVE
Ketones, ur: 40 — AB
Leukocytes, UA: NEGATIVE
NITRITE: NEGATIVE
SPECIFIC GRAVITY, URINE: 1.02 (ref 1.000–1.030)
Total Protein, Urine: NEGATIVE
Urine Glucose: NEGATIVE
Urobilinogen, UA: 0.2 (ref 0.0–1.0)
pH: 8 (ref 5.0–8.0)

## 2014-12-28 LAB — TSH: TSH: 1.41 u[IU]/mL (ref 0.35–4.50)

## 2014-12-30 ENCOUNTER — Other Ambulatory Visit: Payer: Self-pay | Admitting: Family Medicine

## 2015-01-17 ENCOUNTER — Ambulatory Visit (INDEPENDENT_AMBULATORY_CARE_PROVIDER_SITE_OTHER): Payer: 59 | Admitting: Family Medicine

## 2015-01-17 ENCOUNTER — Encounter: Payer: Self-pay | Admitting: Family Medicine

## 2015-01-17 VITALS — BP 120/88 | Temp 98.3°F | Ht 67.75 in | Wt 163.0 lb

## 2015-01-17 DIAGNOSIS — N529 Male erectile dysfunction, unspecified: Secondary | ICD-10-CM

## 2015-01-17 DIAGNOSIS — E785 Hyperlipidemia, unspecified: Secondary | ICD-10-CM

## 2015-01-17 DIAGNOSIS — Z820 Family history of epilepsy and other diseases of the nervous system: Secondary | ICD-10-CM

## 2015-01-17 DIAGNOSIS — Z87442 Personal history of urinary calculi: Secondary | ICD-10-CM

## 2015-01-17 DIAGNOSIS — I10 Essential (primary) hypertension: Secondary | ICD-10-CM

## 2015-01-17 DIAGNOSIS — Z8669 Personal history of other diseases of the nervous system and sense organs: Secondary | ICD-10-CM

## 2015-01-17 DIAGNOSIS — F319 Bipolar disorder, unspecified: Secondary | ICD-10-CM

## 2015-01-17 DIAGNOSIS — J302 Other seasonal allergic rhinitis: Secondary | ICD-10-CM

## 2015-01-17 MED ORDER — SILDENAFIL CITRATE 100 MG PO TABS: 100.0000 mg | ORAL_TABLET | Freq: Every day | ORAL | Status: DC | PRN

## 2015-01-17 MED ORDER — TOPIRAMATE 100 MG PO TABS: 100.0000 mg | ORAL_TABLET | Freq: Every day | ORAL | Status: DC

## 2015-01-17 MED ORDER — LAMOTRIGINE 100 MG PO TABS: 100.0000 mg | ORAL_TABLET | Freq: Every day | ORAL | Status: DC

## 2015-01-17 MED ORDER — CHLORTHALIDONE 25 MG PO TABS: 25.0000 mg | ORAL_TABLET | Freq: Every day | ORAL | Status: DC

## 2015-01-17 MED ORDER — SIMVASTATIN 10 MG PO TABS: ORAL_TABLET | ORAL | Status: DC

## 2015-01-17 MED ORDER — POTASSIUM CITRATE ER 10 MEQ (1080 MG) PO TBCR: EXTENDED_RELEASE_TABLET | ORAL | Status: DC

## 2015-01-17 NOTE — Progress Notes (Signed)
Pre visit review using our clinic review tool, if applicable. No additional management support is needed unless otherwise documented below in the visit note. 

## 2015-01-17 NOTE — Progress Notes (Signed)
   Subjective:    Patient ID: Jimmy Holmes, male    DOB: 04/28/1961, 54 y.o.   MRN: [id number]  HPI Jimmy Holmes is a 54 year old married male nonsmoker who comes in today for general physical examination because of a history of hypertension, bipolar disorder, recurrent kidney stones, migraine headaches, hyperlipidemia, and erectile dysfunction  His med list is correct. He says he feels well and has no complaints. He is migraines have decreased in frequency and severity on the Topamax.  He gets routine eye care, dental care, set him up for a colonoscopy at age 54 last year but he didn't get to go. He says he is to busy and can't take any time off at work. He would like to investigate the new noninvasive screening colon cancer test  Tetanus booster 2014   Review of Systems  Constitutional: Negative.   HENT: Negative.   Eyes: Negative.   Respiratory: Negative.   Cardiovascular: Negative.   Gastrointestinal: Negative.   Endocrine: Negative.   Genitourinary: Negative.   Musculoskeletal: Negative.   Skin: Negative.   Allergic/Immunologic: Negative.   Neurological: Negative.   Hematological: Negative.   Psychiatric/Behavioral: Negative.        Objective:   Physical Exam  Constitutional: He is oriented to person, place, and time. He appears well-developed and well-nourished.  HENT:  Head: Normocephalic and atraumatic.  Right Ear: External ear normal.  Left Ear: External ear normal.  Nose: Nose normal.  Mouth/Throat: Oropharynx is clear and moist.  Eyes: Conjunctivae and EOM are normal. Pupils are equal, round, and reactive to light.  Neck: Normal range of motion. Neck supple. No JVD present. No tracheal deviation present. No thyromegaly present.  Cardiovascular: Normal rate, regular rhythm, normal heart sounds and intact distal pulses.  Exam reveals no gallop and no friction rub.   No murmur heard. Pulmonary/Chest: Effort normal and breath sounds normal. No stridor. No  respiratory distress. He has no wheezes. He has no rales. He exhibits no tenderness.  Abdominal: Soft. Bowel sounds are normal. He exhibits no distension and no mass. There is no tenderness. There is no rebound and no guarding.  Genitourinary: Rectum normal, prostate normal and penis normal. Guaiac negative stool. No penile tenderness.  Musculoskeletal: Normal range of motion. He exhibits no edema or tenderness.  Lymphadenopathy:    He has no cervical adenopathy.  Neurological: He is alert and oriented to person, place, and time. He has normal reflexes. No cranial nerve deficit. He exhibits normal muscle tone.  Skin: Skin is warm and dry. No rash noted. No erythema. No pallor.  Total body skin exam normal multiple tattoos especially on his back he has a flag of [place]. He was in the [employer]  Psychiatric: He has a normal mood and affect. His behavior is normal. Judgment and thought content normal.          Assessment & Plan:  Healthy male  Allergic rhinitis continue OTC Zyrtec  Mild hypertension at goal....... continue chlorthalidone 25 mg daily  History of bipolar depression on Lamictal 100 mg daily.......  History recurrent kidney stones asymptomatic........ continue your current K 10 mg 3 times during the day and 2 tabs at bedtime  Migraine headaches improved on Topamax 100 mg  Hyperlipidemia goal on Zocor 10 mg  Erectile dysfunction refill Viagra..[name] Kitchen

## 2015-01-17 NOTE — Patient Instructions (Signed)
Continue current medications  Follow-up in one year sooner if any problem 

## 2015-12-27 ENCOUNTER — Telehealth: Payer: Self-pay | Admitting: Family Medicine

## 2015-12-27 DIAGNOSIS — E785 Hyperlipidemia, unspecified: Secondary | ICD-10-CM

## 2015-12-27 DIAGNOSIS — J302 Other seasonal allergic rhinitis: Secondary | ICD-10-CM

## 2015-12-27 DIAGNOSIS — I10 Essential (primary) hypertension: Secondary | ICD-10-CM

## 2015-12-27 NOTE — Telephone Encounter (Signed)
Pt has physical schedule for 08-10-16 and would like to have cpx labs drawn at [facility]. Please put order in system

## 2015-12-27 NOTE — Telephone Encounter (Signed)
Lab orders placed.  

## 2016-01-03 ENCOUNTER — Other Ambulatory Visit: Payer: Self-pay | Admitting: Family Medicine

## 2016-01-03 DIAGNOSIS — E785 Hyperlipidemia, unspecified: Secondary | ICD-10-CM

## 2016-01-03 DIAGNOSIS — F319 Bipolar disorder, unspecified: Secondary | ICD-10-CM

## 2016-01-03 DIAGNOSIS — Z87442 Personal history of urinary calculi: Secondary | ICD-10-CM

## 2016-01-03 MED ORDER — SIMVASTATIN 10 MG PO TABS: ORAL_TABLET | ORAL | Status: DC

## 2016-01-03 MED ORDER — POTASSIUM CITRATE ER 10 MEQ (1080 MG) PO TBCR: EXTENDED_RELEASE_TABLET | ORAL | Status: DC

## 2016-01-03 MED ORDER — LAMOTRIGINE 100 MG PO TABS: 100.0000 mg | ORAL_TABLET | Freq: Every day | ORAL | Status: DC

## 2016-02-06 ENCOUNTER — Other Ambulatory Visit: Payer: Self-pay | Admitting: Family Medicine

## 2016-05-17 ENCOUNTER — Other Ambulatory Visit: Payer: Self-pay | Admitting: Family Medicine

## 2016-06-25 ENCOUNTER — Other Ambulatory Visit: Payer: Self-pay | Admitting: Family Medicine

## 2016-06-25 DIAGNOSIS — Z87442 Personal history of urinary calculi: Secondary | ICD-10-CM

## 2016-06-25 NOTE — Telephone Encounter (Signed)
Pt needs a refill on potassium citrate #150 for 30 day supply send to [facility]. Pt has an appt in sept 2017

## 2016-06-29 MED ORDER — POTASSIUM CITRATE ER 10 MEQ (1080 MG) PO TBCR: EXTENDED_RELEASE_TABLET | ORAL | 0 refills | Status: DC

## 2016-06-29 NOTE — Telephone Encounter (Signed)
Rx sent in

## 2016-06-29 NOTE — Telephone Encounter (Signed)
Dr. Tawanna Coolerodd spoke to write and approved refill

## 2016-07-01 ENCOUNTER — Other Ambulatory Visit: Payer: Self-pay | Admitting: Family Medicine

## 2016-07-01 DIAGNOSIS — Z87442 Personal history of urinary calculi: Secondary | ICD-10-CM

## 2016-07-01 MED ORDER — POTASSIUM CITRATE ER 10 MEQ (1080 MG) PO TBCR: EXTENDED_RELEASE_TABLET | ORAL | 1 refills | Status: DC

## 2016-07-30 ENCOUNTER — Telehealth: Payer: Self-pay | Admitting: Emergency Medicine

## 2016-07-30 NOTE — Telephone Encounter (Signed)
Called and left message for pt to return call to office. Received refill request for Chlorthalidone 25mg   Pt has not been seen in office since 01/17/15 and medication is not on medication list. Calling to verify if pt was currently taking medication.

## 2016-08-03 ENCOUNTER — Other Ambulatory Visit (INDEPENDENT_AMBULATORY_CARE_PROVIDER_SITE_OTHER): Payer: 59

## 2016-08-03 DIAGNOSIS — I10 Essential (primary) hypertension: Secondary | ICD-10-CM | POA: Diagnosis not present

## 2016-08-03 DIAGNOSIS — J302 Other seasonal allergic rhinitis: Secondary | ICD-10-CM | POA: Diagnosis not present

## 2016-08-03 DIAGNOSIS — E785 Hyperlipidemia, unspecified: Secondary | ICD-10-CM | POA: Diagnosis not present

## 2016-08-03 LAB — CBC WITH DIFFERENTIAL/PLATELET
BASOS PCT: 0.3 % (ref 0.0–3.0)
Basophils Absolute: 0 10*3/uL (ref 0.0–0.1)
EOS ABS: 0.2 10*3/uL (ref 0.0–0.7)
Eosinophils Relative: 2.9 % (ref 0.0–5.0)
HCT: 50.5 % (ref 39.0–52.0)
HEMOGLOBIN: 17.5 g/dL — AB (ref 13.0–17.0)
Lymphocytes Relative: 21 % (ref 12.0–46.0)
Lymphs Abs: 1.5 10*3/uL (ref 0.7–4.0)
MCHC: 34.5 g/dL (ref 30.0–36.0)
MCV: 91.3 fl (ref 78.0–100.0)
MONO ABS: 0.5 10*3/uL (ref 0.1–1.0)
Monocytes Relative: 7.2 % (ref 3.0–12.0)
Neutro Abs: 5 10*3/uL (ref 1.4–7.7)
Neutrophils Relative %: 68.6 % (ref 43.0–77.0)
Platelets: 187 10*3/uL (ref 150.0–400.0)
RBC: 5.53 Mil/uL (ref 4.22–5.81)
RDW: 12.9 % (ref 11.5–15.5)
WBC: 7.3 10*3/uL (ref 4.0–10.5)

## 2016-08-03 LAB — BASIC METABOLIC PANEL
BUN: 13 mg/dL (ref 6–23)
CO2: 32 mEq/L (ref 19–32)
Calcium: 9.7 mg/dL (ref 8.4–10.5)
Chloride: 100 mEq/L (ref 96–112)
Creatinine, Ser: 0.96 mg/dL (ref 0.40–1.50)
GFR: 86.91 mL/min (ref >60.00)
Glucose, Bld: 93 mg/dL (ref 70–99)
POTASSIUM: 3.6 meq/L (ref 3.5–5.1)
Sodium: 137 mEq/L (ref 135–145)

## 2016-08-03 LAB — HEPATIC FUNCTION PANEL
ALBUMIN: 4.8 g/dL (ref 3.5–5.2)
ALT: 19 U/L (ref 0–53)
AST: 21 U/L (ref 0–37)
Alkaline Phosphatase: 51 U/L (ref 39–117)
Bilirubin, Direct: 0.1 mg/dL (ref 0.0–0.3)
TOTAL PROTEIN: 7.1 g/dL (ref 6.0–8.3)
Total Bilirubin: 0.7 mg/dL (ref 0.2–1.2)

## 2016-08-03 LAB — LIPID PANEL
CHOLESTEROL: 169 mg/dL (ref 0–200)
HDL: 53.6 mg/dL (ref >39.00)
LDL CALC: 107 mg/dL — AB (ref 0–99)
NonHDL: 115.62
TRIGLYCERIDES: 42 mg/dL (ref 0.0–149.0)
Total CHOL/HDL Ratio: 3
VLDL: 8.4 mg/dL (ref 0.0–40.0)

## 2016-08-03 LAB — TSH: TSH: 3.08 u[IU]/mL (ref 0.35–4.50)

## 2016-08-03 LAB — PSA: PSA: 0.61 ng/mL (ref 0.10–4.00)

## 2016-08-10 ENCOUNTER — Encounter: Payer: Self-pay | Admitting: Family Medicine

## 2016-08-10 ENCOUNTER — Ambulatory Visit (INDEPENDENT_AMBULATORY_CARE_PROVIDER_SITE_OTHER): Payer: PRIVATE HEALTH INSURANCE | Admitting: Family Medicine

## 2016-08-10 VITALS — BP 124/88 | HR 64 | Temp 98.4°F | Resp 16 | Ht 68.0 in | Wt 168.6 lb

## 2016-08-10 DIAGNOSIS — Q758 Other specified congenital malformations of skull and face bones: Secondary | ICD-10-CM

## 2016-08-10 DIAGNOSIS — Z0000 Encounter for general adult medical examination without abnormal findings: Secondary | ICD-10-CM | POA: Diagnosis not present

## 2016-08-10 DIAGNOSIS — F319 Bipolar disorder, unspecified: Secondary | ICD-10-CM | POA: Diagnosis not present

## 2016-08-10 DIAGNOSIS — Z820 Family history of epilepsy and other diseases of the nervous system: Secondary | ICD-10-CM

## 2016-08-10 DIAGNOSIS — N529 Male erectile dysfunction, unspecified: Secondary | ICD-10-CM | POA: Diagnosis not present

## 2016-08-10 DIAGNOSIS — Q752 Hypertelorism: Secondary | ICD-10-CM

## 2016-08-10 DIAGNOSIS — E785 Hyperlipidemia, unspecified: Secondary | ICD-10-CM

## 2016-08-10 MED ORDER — SUMATRIPTAN 20 MG/ACT NA SOLN: 1.0000 | NASAL | 11 refills | Status: DC | PRN

## 2016-08-10 MED ORDER — SILDENAFIL CITRATE 100 MG PO TABS: 100.0000 mg | ORAL_TABLET | Freq: Every day | ORAL | 11 refills | Status: DC | PRN

## 2016-08-10 MED ORDER — CHLORTHALIDONE 25 MG PO TABS: 25.0000 mg | ORAL_TABLET | Freq: Every day | ORAL | 3 refills | Status: DC

## 2016-08-10 MED ORDER — SIMVASTATIN 10 MG PO TABS: ORAL_TABLET | ORAL | 3 refills | Status: DC

## 2016-08-10 MED ORDER — LAMOTRIGINE 100 MG PO TABS: 100.0000 mg | ORAL_TABLET | Freq: Every day | ORAL | 3 refills | Status: DC

## 2016-08-10 MED ORDER — SILDENAFIL CITRATE 20 MG PO TABS: 20.0000 mg | ORAL_TABLET | Freq: Every evening | ORAL | 11 refills | Status: DC | PRN

## 2016-08-10 MED ORDER — TOPIRAMATE 100 MG PO TABS: 100.0000 mg | ORAL_TABLET | Freq: Every day | ORAL | 3 refills | Status: DC

## 2016-08-10 NOTE — Progress Notes (Signed)
Jimmy Holmes is a 55 year old married male nonsmoker....... ex-marine.... Adopted...[name] Kitchen.[name] Kitchen.[name] Kitchen. who comes in today for physical examination because of a history of mild hypertension, depression, hyperlipidemia, migraine headaches,  He takes Topamax 100 mg daily for migraines he hasn't had a migraine in years on the Topamax  He takes 10 mg Zocor daily along with an aspirin tablet  He uses Viagra when necessary for ED  He takes Lamictal 100 mg daily because of a history of depression.  He takes Viagra time 25 mg and potassium supplement 10 mg for mild hypertension. BP today 124/88  He gets routine eye care, dental care, advised to get a colonoscopy he's declined so far. We'll try to get in to GI again.  Vaccinations tetanus 2014 declines a flu shot  Review of systems otherwise negative  Physical examination  Vital signs stable is afebrile HEENT were negative neck was supple no adenopathy thyroid normal no carotid bruits cardiopulmonary exam normal abdominal exam normal GEN 10 normal circumcised male rectum normal stool guaiac-negative prostate normal extremity normal skin no peripheral pulses normal except for multiple tattoos  Impression  #1 healthy male  #2 mild hypertension.....[name] Kitchen. continue current medicine  #3 hyperlipidemia.....[name] Kitchen. continue Zocor and aspirin  #4 history of migraine headaches asymptomatic on Topamax 100 mg daily  #5 history of depression continue Lamictal 100 mg daily  #6 erectile dysfunction.......[name] Kitchen. generic Viagra

## 2016-08-10 NOTE — Patient Instructions (Signed)
Continue current medications  I recommend a screening colonoscopy..........[name] Kitchen. we will get you set up for a consult in GI to discuss the various options  Return in one year for general physical exam sooner if any problems  We have 3 new young people who will be taking over for me. [place] our nurse practitioners in our new doctor is Dr. SwazilandJordan

## 2016-08-10 NOTE — Progress Notes (Signed)
Pre visit review using our clinic review tool, if applicable. No additional management support is needed unless otherwise documented below in the visit note. 

## 2016-09-09 ENCOUNTER — Encounter: Payer: Self-pay | Admitting: Family Medicine

## 2016-12-30 ENCOUNTER — Ambulatory Visit (INDEPENDENT_AMBULATORY_CARE_PROVIDER_SITE_OTHER): Payer: PRIVATE HEALTH INSURANCE | Admitting: Family Medicine

## 2016-12-30 ENCOUNTER — Encounter: Payer: Self-pay | Admitting: Family Medicine

## 2016-12-30 VITALS — BP 138/83 | HR 82 | Temp 99.1°F | Ht 68.0 in | Wt 164.0 lb

## 2016-12-30 DIAGNOSIS — J209 Acute bronchitis, unspecified: Secondary | ICD-10-CM

## 2016-12-30 MED ORDER — AZITHROMYCIN 250 MG PO TABS: ORAL_TABLET | ORAL | 0 refills | Status: DC

## 2016-12-30 NOTE — Progress Notes (Signed)
   Subjective:    Patient ID: Jimmy Holmes, male    DOB: 10/07/1961, 56 y.o.   MRN: [id number]  HPI Here for 2 days of fever, PND, chest congestion and a dry cough. Using Nyquil at night.    Review of Systems  Constitutional: Positive for fever.  HENT: Positive for congestion. Negative for postnasal drip, sinus pain, sinus pressure and sore throat.   Eyes: Negative.   Respiratory: Positive for cough and chest tightness.        Objective:   Physical Exam  Constitutional: He appears well-developed and well-nourished.  HENT:  Right Ear: External ear normal.  Left Ear: External ear normal.  Nose: Nose normal.  Mouth/Throat: Oropharynx is clear and moist.  Eyes: Conjunctivae are normal.  Neck: No thyromegaly present.  Pulmonary/Chest: Effort normal. He has no wheezes. He has no rales.  scattered rhonchi   Lymphadenopathy:    He has no cervical adenopathy.          Assessment & Plan:  Bronchitis, treat with a Zpack.  Gershon CraneStephen Shelvy Heckert, MD

## 2016-12-30 NOTE — Progress Notes (Signed)
Pre visit review using our clinic review tool, if applicable. No additional management support is needed unless otherwise documented below in the visit note. 

## 2017-01-01 ENCOUNTER — Telehealth: Payer: Self-pay | Admitting: Family Medicine

## 2017-01-01 NOTE — Telephone Encounter (Signed)
Spoke with pt and he reports that he is not improving even on Zpak. Advised pt that he needs to be seen and re-evaluated. Pt agrees. Pt scheduled for [facility] Saturday [facility]. Nothing further needed.

## 2017-01-01 NOTE — Telephone Encounter (Signed)
Wife states pt seen 2/07 and dx with bronchitis.  Pt not given a flu test. Wife states pt has now started running a fever. 102 yesterday and today it is 100.8.  Pt wondering if this is normal. Pt feels like he is worse.  Pt is at home.

## 2017-01-02 ENCOUNTER — Encounter: Payer: Self-pay | Admitting: Family Medicine

## 2017-01-02 ENCOUNTER — Ambulatory Visit (INDEPENDENT_AMBULATORY_CARE_PROVIDER_SITE_OTHER): Payer: No Typology Code available for payment source | Admitting: Family Medicine

## 2017-01-02 VITALS — BP 146/86 | HR 70 | Temp 99.4°F | Ht 68.0 in | Wt 162.0 lb

## 2017-01-02 DIAGNOSIS — J111 Influenza due to unidentified influenza virus with other respiratory manifestations: Secondary | ICD-10-CM | POA: Diagnosis not present

## 2017-01-02 DIAGNOSIS — R6889 Other general symptoms and signs: Secondary | ICD-10-CM

## 2017-01-02 LAB — POCT INFLUENZA A/B
INFLUENZA B, POC: POSITIVE — AB
Influenza A, POC: POSITIVE — AB

## 2017-01-02 MED ORDER — BENZONATATE 200 MG PO CAPS: 200.0000 mg | ORAL_CAPSULE | Freq: Three times a day (TID) | ORAL | 0 refills | Status: DC | PRN

## 2017-01-02 MED ORDER — ALBUTEROL SULFATE HFA 108 (90 BASE) MCG/ACT IN AERS: 2.0000 | INHALATION_SPRAY | Freq: Four times a day (QID) | RESPIRATORY_TRACT | 0 refills | Status: DC | PRN

## 2017-01-02 MED ORDER — OSELTAMIVIR PHOSPHATE 75 MG PO CAPS: 75.0000 mg | ORAL_CAPSULE | Freq: Two times a day (BID) | ORAL | 0 refills | Status: DC

## 2017-01-02 NOTE — Progress Notes (Signed)
Flu test positive.  D/w pt.    Sx started about 4 days ago.  Seen and dx'd with bronchitis.  Started on zmax. In the meantime, his sx are waxing and waning.  Fatigued.  Fevers, cough, some aches.  No vomiting, no diarrhea.    Meds, vitals, and allergies reviewed.   ROS: Per HPI unless specifically indicated in ROS section   GEN: nad, alert and oriented HEENT: mucous membranes moist, tm w/o erythema, nasal exam w/o erythema, clear discharge noted,  OP with cobblestoning NECK: supple w/o LA CV: rrr.   PULM: cough noted but ctab, no inc wob, no wheeze.  EXT: no edema SKIN: no acute rash

## 2017-01-02 NOTE — Patient Instructions (Addendum)
Stop zithromax, start tamiflu.  Rest and fluids.  Use tessalon and/or albuterol for the cough.   Take care.  Glad to see you.

## 2017-01-02 NOTE — Assessment & Plan Note (Signed)
Nontoxic.  Stop zithromax, start tamiflu.  Rest and fluids.  Use tessalon and/or albuterol for the cough.   Fu prn. He agrees.

## 2017-02-04 ENCOUNTER — Ambulatory Visit: Payer: Self-pay

## 2017-02-04 ENCOUNTER — Other Ambulatory Visit: Payer: Self-pay | Admitting: Occupational Medicine

## 2017-02-04 DIAGNOSIS — M79641 Pain in right hand: Secondary | ICD-10-CM

## 2017-02-04 DIAGNOSIS — R519 Headache, unspecified: Secondary | ICD-10-CM

## 2017-02-04 DIAGNOSIS — R51 Headache: Secondary | ICD-10-CM

## 2017-02-25 ENCOUNTER — Ambulatory Visit: Payer: Self-pay

## 2017-02-25 ENCOUNTER — Other Ambulatory Visit: Payer: Self-pay | Admitting: Occupational Medicine

## 2017-02-25 DIAGNOSIS — M79644 Pain in right finger(s): Secondary | ICD-10-CM

## 2017-03-11 ENCOUNTER — Other Ambulatory Visit: Payer: Self-pay | Admitting: Family Medicine

## 2017-03-11 DIAGNOSIS — Z87442 Personal history of urinary calculi: Secondary | ICD-10-CM

## 2017-03-12 NOTE — Telephone Encounter (Signed)
Sent to the pharmacy by e-scribe for 6 months.  Pt due back for cpx 9/18

## 2017-05-28 ENCOUNTER — Other Ambulatory Visit: Payer: Self-pay | Admitting: Family Medicine

## 2017-05-28 DIAGNOSIS — Z87442 Personal history of urinary calculi: Secondary | ICD-10-CM

## 2017-05-28 NOTE — Telephone Encounter (Signed)
Denied.  Filled on 03/12/17 for 6 months.   Message sent to the pharmacy to check file.

## 2017-08-13 ENCOUNTER — Encounter: Payer: Self-pay | Admitting: Family Medicine

## 2017-08-21 ENCOUNTER — Other Ambulatory Visit: Payer: Self-pay | Admitting: Family Medicine

## 2017-08-21 DIAGNOSIS — F319 Bipolar disorder, unspecified: Secondary | ICD-10-CM

## 2017-08-21 DIAGNOSIS — E785 Hyperlipidemia, unspecified: Secondary | ICD-10-CM

## 2017-08-23 ENCOUNTER — Ambulatory Visit (INDEPENDENT_AMBULATORY_CARE_PROVIDER_SITE_OTHER): Payer: No Typology Code available for payment source | Admitting: Family Medicine

## 2017-08-23 ENCOUNTER — Encounter: Payer: Self-pay | Admitting: Family Medicine

## 2017-08-23 VITALS — BP 120/88 | HR 70 | Temp 98.5°F | Ht 68.0 in | Wt 169.0 lb

## 2017-08-23 DIAGNOSIS — E7841 Elevated Lipoprotein(a): Secondary | ICD-10-CM | POA: Diagnosis not present

## 2017-08-23 DIAGNOSIS — G8929 Other chronic pain: Secondary | ICD-10-CM | POA: Diagnosis not present

## 2017-08-23 DIAGNOSIS — E785 Hyperlipidemia, unspecified: Secondary | ICD-10-CM | POA: Diagnosis not present

## 2017-08-23 DIAGNOSIS — Z23 Encounter for immunization: Secondary | ICD-10-CM | POA: Diagnosis not present

## 2017-08-23 DIAGNOSIS — I10 Essential (primary) hypertension: Secondary | ICD-10-CM | POA: Diagnosis not present

## 2017-08-23 DIAGNOSIS — Z0000 Encounter for general adult medical examination without abnormal findings: Secondary | ICD-10-CM | POA: Insufficient documentation

## 2017-08-23 DIAGNOSIS — N529 Male erectile dysfunction, unspecified: Secondary | ICD-10-CM

## 2017-08-23 DIAGNOSIS — Z8669 Personal history of other diseases of the nervous system and sense organs: Secondary | ICD-10-CM

## 2017-08-23 DIAGNOSIS — M545 Low back pain: Secondary | ICD-10-CM | POA: Diagnosis not present

## 2017-08-23 DIAGNOSIS — R6882 Decreased libido: Secondary | ICD-10-CM | POA: Diagnosis not present

## 2017-08-23 DIAGNOSIS — Z87442 Personal history of urinary calculi: Secondary | ICD-10-CM

## 2017-08-23 DIAGNOSIS — F3178 Bipolar disorder, in full remission, most recent episode mixed: Secondary | ICD-10-CM

## 2017-08-23 LAB — POCT URINALYSIS DIPSTICK
Bilirubin, UA: NEGATIVE
Blood, UA: NEGATIVE
Glucose, UA: NEGATIVE
Ketones, UA: NEGATIVE
LEUKOCYTES UA: NEGATIVE
Nitrite, UA: NEGATIVE
PH UA: 8.5 — AB (ref 5.0–8.0)
PROTEIN UA: NEGATIVE
SPEC GRAV UA: 1.015 (ref 1.010–1.025)
UROBILINOGEN UA: 0.2 U/dL

## 2017-08-23 LAB — CBC WITH DIFFERENTIAL/PLATELET
Basophils Absolute: 0 10*3/uL (ref 0.0–0.1)
Basophils Relative: 0.7 % (ref 0.0–3.0)
EOS ABS: 0.1 10*3/uL (ref 0.0–0.7)
Eosinophils Relative: 1.6 % (ref 0.0–5.0)
HCT: 48.4 % (ref 39.0–52.0)
HEMOGLOBIN: 16.9 g/dL (ref 13.0–17.0)
LYMPHS ABS: 0.7 10*3/uL (ref 0.7–4.0)
Lymphocytes Relative: 13 % (ref 12.0–46.0)
MCHC: 34.8 g/dL (ref 30.0–36.0)
MCV: 92.2 fl (ref 78.0–100.0)
MONO ABS: 0.3 10*3/uL (ref 0.1–1.0)
Monocytes Relative: 5.4 % (ref 3.0–12.0)
NEUTROS PCT: 79.3 % — AB (ref 43.0–77.0)
Neutro Abs: 4.2 10*3/uL (ref 1.4–7.7)
Platelets: 175 10*3/uL (ref 150.0–400.0)
RBC: 5.25 Mil/uL (ref 4.22–5.81)
RDW: 13.1 % (ref 11.5–15.5)
WBC: 5.3 10*3/uL (ref 4.0–10.5)

## 2017-08-23 LAB — BASIC METABOLIC PANEL
BUN: 18 mg/dL (ref 6–23)
CHLORIDE: 100 meq/L (ref 96–112)
CO2: 31 mEq/L (ref 19–32)
CREATININE: 0.91 mg/dL (ref 0.40–1.50)
Calcium: 9.8 mg/dL (ref 8.4–10.5)
GFR: 92.08 mL/min (ref >60.00)
Glucose, Bld: 114 mg/dL — ABNORMAL HIGH (ref 70–99)
POTASSIUM: 3.6 meq/L (ref 3.5–5.1)
Sodium: 138 mEq/L (ref 135–145)

## 2017-08-23 LAB — HEPATIC FUNCTION PANEL
ALK PHOS: 49 U/L (ref 39–117)
ALT: 23 U/L (ref 0–53)
AST: 23 U/L (ref 0–37)
Albumin: 4.6 g/dL (ref 3.5–5.2)
BILIRUBIN DIRECT: 0.1 mg/dL (ref 0.0–0.3)
BILIRUBIN TOTAL: 0.7 mg/dL (ref 0.2–1.2)
Total Protein: 6.6 g/dL (ref 6.0–8.3)

## 2017-08-23 LAB — PSA: PSA: 0.54 ng/mL (ref 0.10–4.00)

## 2017-08-23 LAB — LIPID PANEL
CHOL/HDL RATIO: 3
Cholesterol: 158 mg/dL (ref 0–200)
HDL: 51.7 mg/dL (ref >39.00)
LDL CALC: 100 mg/dL — AB (ref 0–99)
NONHDL: 106.6
Triglycerides: 35 mg/dL (ref 0.0–149.0)
VLDL: 7 mg/dL (ref 0.0–40.0)

## 2017-08-23 LAB — TSH: TSH: 1.43 u[IU]/mL (ref 0.35–4.50)

## 2017-08-23 MED ORDER — SIMVASTATIN 10 MG PO TABS: ORAL_TABLET | ORAL | 4 refills | Status: DC

## 2017-08-23 MED ORDER — LAMOTRIGINE 100 MG PO TABS: 100.0000 mg | ORAL_TABLET | Freq: Every day | ORAL | 4 refills | Status: DC

## 2017-08-23 MED ORDER — POTASSIUM CITRATE ER 10 MEQ (1080 MG) PO TBCR: EXTENDED_RELEASE_TABLET | ORAL | 4 refills | Status: DC

## 2017-08-23 MED ORDER — TOPIRAMATE 100 MG PO TABS: 100.0000 mg | ORAL_TABLET | Freq: Every day | ORAL | 4 refills | Status: DC

## 2017-08-23 MED ORDER — CHLORTHALIDONE 25 MG PO TABS: 25.0000 mg | ORAL_TABLET | Freq: Every day | ORAL | 4 refills | Status: DC

## 2017-08-23 NOTE — Progress Notes (Signed)
Jimmy Holmes is a 56 year old married male nonsmoker who comes in today for general physical examination because of a history of hyperlipidemia, migraine headaches, a new problem of hearing loss low back pain and left elbow pain.  He takes chlorthalidone 25 mg daily along with uro crit K because of a history of recurrent kidney stones. On this program he has no kidney stones  He takes Zocor 10 mg daily for hyperlipidemia along with an aspirin tablet. Labs today  He has a history of bipolar depression takes Lamictal 100 mg daily and is doing well.  He takes Topamax 100 mg daily because of history of migraines. On the Topamax he's had no more headaches.  He's had a history of hearing loss. He was in the [employer]. He has all the paperwork for hearing aid to get at the [place] but he hasn't done the paperwork yet  For the past year and half he said problem of pain in the right side of his back. He points the right SI joint as a source of his pain. He says it's constant dull a 6 on a scale of 1-10. It's increased by standing. No bowel or bladder dysfunction. Occasional radiate down to his outer thigh. No neurologic symptoms. No history of trauma.  He also has a history of soreness in his left elbow. He works on his hands and knees. He must a bruise that left elbow because he has an obvious resolving hematoma there.  He gets routine eye care, dental care, never had a colonoscopy. Reluctant to have any screening: Test. Will refer him to Dr. Leone Payor for consult to discuss colonoscopy versus noninvasive screening test for colon cancer.  Vaccinations seasonal flu shot today tetanus booster 2014  Social history he's married he lives in [place]. He works on his hands and knees.  He's also uses Viagra when necessary for ED however he says he has no sex drive. He would like this evaluated is becoming a marital issue.  BP 120/88 (BP Location: Left Arm, Patient Position: Sitting, Cuff Size: Normal)   Pulse 70    Temp 98.5 F (36.9 C) (Oral)   Ht  (1.727 m)   Wt 169 lb (76.7 kg)   BMI 25.70 kg/m  Examination HEENT were negative neck was supple thyroid is not enlarged cardiopulmonary exam normal abdominal exam normal genitalia normal circumcised male rectum normal stool guaiac-negative prostate normal external ears: Skin no peripheral pulses normal  #1 hyperlipidemia........ continue Zocor and aspirin  #2 history of bipolar depression.....[name] Kitchen continue Lamictal 100 mg daily  #3 history of recurrent kidney stones........... continue chlorthalidone and potassiums citrate 50 mg daily  #4 migraine headaches........ asymptomatic on Topamax 100 mg daily  #5 olecranon bursitis left elbow........ advise bracing elevation ice etc.  #6 right-sided low back pain........ unresolved with conservative therapy...[name] KitchenMarland KitchenMarland Kitchen normal neurologic exam..... PT consult  #7 decreased libido with ED........ check T level........ consult with urology when necessary

## 2017-08-23 NOTE — Patient Instructions (Signed)
Continue current medications  Labs today....... I will call if there is anything abnormal.  We will also check a testosterone level to begin evaluating the issues surrounding your concerns about decreased libido and sexual dysfunction  We'll also get a consult physical therapy to address her low back pain  Also put in a consult note for Dr. Stan Head. He will meet with you and discussed the various screening test for colon cancer

## 2017-08-24 LAB — HEPATITIS C ANTIBODY
Hepatitis C Ab: NONREACTIVE
SIGNAL TO CUT-OFF: 0.01 (ref <1.00)

## 2017-08-25 ENCOUNTER — Encounter: Payer: Self-pay | Admitting: Family Medicine

## 2017-08-25 ENCOUNTER — Encounter: Payer: Self-pay | Admitting: Internal Medicine

## 2017-08-25 LAB — TESTOSTERONE,FREE AND TOTAL
TESTOSTERONE FREE: 11.7 pg/mL (ref 7.2–24.0)
TESTOSTERONE: 563 ng/dL (ref 264–916)

## 2017-08-30 ENCOUNTER — Ambulatory Visit: Payer: No Typology Code available for payment source | Attending: Family Medicine | Admitting: Physical Therapy

## 2017-08-30 ENCOUNTER — Encounter: Payer: Self-pay | Admitting: Physical Therapy

## 2017-08-30 DIAGNOSIS — M62830 Muscle spasm of back: Secondary | ICD-10-CM | POA: Insufficient documentation

## 2017-08-30 DIAGNOSIS — R293 Abnormal posture: Secondary | ICD-10-CM | POA: Insufficient documentation

## 2017-08-30 DIAGNOSIS — G8929 Other chronic pain: Secondary | ICD-10-CM | POA: Diagnosis present

## 2017-08-30 DIAGNOSIS — M545 Low back pain: Secondary | ICD-10-CM | POA: Diagnosis not present

## 2017-08-30 DIAGNOSIS — M6281 Muscle weakness (generalized): Secondary | ICD-10-CM | POA: Insufficient documentation

## 2017-08-30 NOTE — Therapy (Signed)
[facility] [address], [address] [place], [place], [address] Phone: [phone]   Fax:  [phone]  Physical Therapy Evaluation  Patient Details  Name: Jimmy Holmes MRN: [id number] Date of Birth: [birth year] Referring Provider: Kelle Darting, MD   Encounter Date: 08/30/2017      PT End of Session - 08/30/17 1954    Visit Number 1   Number of Visits 17   Date for PT Re-Evaluation 10/25/17   Authorization Type UHC   Authorization Time Period 08/30/17 to 10/25/17   PT Start Time 1602   PT Stop Time 1649   PT Time Calculation (min) 47 min   Activity Tolerance Patient tolerated treatment well;No increased pain   Behavior During Therapy WFL for tasks assessed/performed      Past Medical History:  Diagnosis Date  . ADHD (attention deficit hyperactivity disorder)   . Allergy   . Asthma   . Chronic kidney disease   . ED (erectile dysfunction)   . Hyperlipidemia   . Migraine     History reviewed. No pertinent surgical history.  There were no vitals filed for this visit.       Subjective Assessment - 08/30/17 1607    Subjective Pt reports that his back has been bothering him for the past several years, atleast since he started his recent job. He reports that some days are good and some are bad, but it is always there. He has been managing the pain with over the counter medication. He has tried a TENS unit, but does not feel that this helped much.    Diagnostic tests none for lumbar spine    Patient Stated Goals decrease pain with activity   Currently in Pain? Yes   Pain Score 5    Pain Location Back   Pain Orientation Right;Lower   Pain Descriptors / Indicators Dull;Aching   Pain Type Chronic pain   Pain Onset More than a month ago   Pain Frequency Constant   Aggravating Factors  unsure, standing/sitting, laying on his back, rolling   Pain Relieving Factors unsure because he does not try anything   Effect of  Pain on Daily Activities moderate, but able to push through the pain             [facility] PT Assessment - 08/30/17 0001      Assessment   Medical Diagnosis Rt sided low back pain    Referring Provider Kelle Darting, MD    Onset Date/Surgical Date --  several years ago, pt unsure    Next MD Visit unsure for this    Prior Therapy none      Balance Screen   Has the patient fallen in the past 6 months No   Has the patient had a decrease in activity level because of a fear of falling?  No   Is the patient reluctant to leave their home because of a fear of falling?  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); heavy lifting     Cognition   Overall Cognitive Status Within Functional Limits for tasks assessed     Sensation   Light Touch Appears Intact   Additional Comments Pt denies numbness/tingling      Functional Tests   Functional tests Other     Other:   Other/ Comments Quad oposite UE/LE reach (Rt LE/Lt UE (+)  trunk twist)      Posture/Postural Control   Posture/Postural Control Postural limitations   Posture Comments rounded shoulders/forward head, decreased lumbar lordosis      ROM / Strength   AROM / PROM / Strength AROM;Strength     AROM   Overall AROM Comments Prone pressup (elbows extended) x15 reps, no increase in pain    AROM Assessment Site Lumbar   Lumbar Flexion RFIS, x10 reps pain coming back up worse than going down   no change    Lumbar Extension REIS, x10 reps pain end range (Rt QL region)   no change      Strength   Strength Assessment Site Hip;Knee;Ankle   Right/Left Hip Right;Left   Right Hip Extension 5/5   Right Hip ABduction 5/5   Left Hip Extension 5/5   Left Hip ABduction 5/5   Right/Left Knee Right;Left   Right Knee Flexion 5/5   Right Knee Extension 5/5   Left Knee Flexion 5/5   Left Knee Extension 5/5   Right/Left Ankle  Right;Left     Flexibility   Soft Tissue Assessment /Muscle Length yes   Hamstrings WNL     Palpation   Palpation comment Tenderness along Rt lumbar paraspinals/QL/multifidi (L2 to L5)             Objective measurements completed on examination: See above findings.                  PT Education - 08/30/17 1634    Education provided Yes   Education Details eval findings/POC; lifting technique; HEP implemented and reviewed; discussed presentation for possible directional preference into extension and adjustments to make that will allow for improved spine posturing   Person(s) Educated Patient   Methods Explanation;Tactile cues;Handout;Verbal cues   Comprehension Verbalized understanding;Returned demonstration          PT Short Term Goals - 08/30/17 2002      PT SHORT TERM GOAL #1   Title Pt will demo consistency and independence with his HEP to increase his trunk stability and decrease pain.    Time 4   Period Weeks   Status New   Target Date 09/27/17     PT SHORT TERM GOAL #2   Title Pt will demo proper set up and consistent use of a lumbar roll to improve his seated posture and decrease low back pain.    Time 4   Period Weeks   Status New     PT SHORT TERM GOAL #3   Title Pt will report atleast 30% improvement in his symptoms from the start of PT, to reflect an increase in his daily activity tolerance.    Time 4   Period Weeks   Status New           PT Long Term Goals - 08/30/17 2022      PT LONG TERM GOAL #1   Title Pt will be able to complete quadruped opposite UE/LE reach for atleast 5 consecutive reps withotu LOB or excessive trunk rotation to demonstrate improved trunk endurance and stability with crawling activity at work.    Time 8   Period Weeks   Status New   Target Date 10/25/17     PT LONG TERM GOAL #2   Title Pt will report decrease in max pain raiting to no greater than 4/10 with activity around his home, to increase his  activity participation.    Time 8   Period Weeks   Status  New     PT LONG TERM GOAL #3   Title Pt will demo proper lifting mechanics with objects up to 50# atleast 3/5 reps, which will improve his safety at work.    Time 8   Period Weeks   Status New     PT LONG TERM GOAL #4   Title Pt will report atleast a 60% improvement in his low back pain and symptoms from the start of PT to increase his quality of life and improve his ability to complete his work activity.    Time 8   Period Weeks   Status New                Plan - 08/30/17 2030    Clinical Impression Statement Pt is a 56 y.o M referred to OPPT with complaints of chronic LBP onset insidiously over the past several years. 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 He demonstrates good BLE strength, denies numbness/tingling, and is able to complete a majority of functional activities without significant difficulty. His primary limitation is with trunk strength and endurance, as well as poor posture and mechanics with repetitive lifting and crawling. He demonstrates poor stability in quadruped as well, which is likely a contributor to his symptoms. He would benefit from skilled PT to address his symptoms and limitations in order to facilitate safe and full return to work activity.    History and Personal Factors relevant to plan of care: --   Clinical Presentation Unstable   Clinical Presentation due to: some days good and bad   Clinical Decision Making Low   Rehab Potential Good   PT Frequency 2x / week   PT Duration 8 weeks   PT Treatment/Interventions ADLs/Self Care Home Management;Electrical Stimulation;Cryotherapy;Moist Heat;Traction;Therapeutic exercise;Therapeutic activities;Functional mobility training;Balance training;Neuromuscular re-education;Patient/family education;Manual techniques;Dry needling;Taping   PT Next Visit  Plan follow up on use of lumbar roll and impact on symptoms; progression of lumbar stabilization exercise; manual techniques and modailites as needed to decrease pain    PT Home Exercise Plan prone press up on hands; lumbar roll; lifting mechanics    Consulted and Agree with Plan of Care Patient      Patient will benefit from skilled therapeutic intervention in order to improve the following deficits and impairments:  Decreased range of motion, Decreased strength, Hypomobility, Improper body mechanics, Pain, Postural dysfunction, Impaired flexibility, Increased muscle spasms  Visit Diagnosis: Chronic right-sided low back pain, with sciatica presence unspecified - Plan: PT plan of care cert/re-cert  Muscle spasm of back - Plan: PT plan of care cert/re-cert  Muscle weakness (generalized) - Plan: PT plan of care cert/re-cert  Abnormal posture - Plan: PT plan of care cert/re-cert     Problem List Patient Active Problem List   Diagnosis Date Noted  . Decreased libido 08/23/2017  . Chronic right-sided low back pain without sciatica 08/23/2017  . Routine general medical examination at a health care facility 08/23/2017  . Hx of migraine headaches 09/29/2012  . Erectile dysfunction 11/11/2011  . Hyperlipidemia 09/17/2008  . Bipolar affective disorder (HCC) 09/17/2008  . Essential hypertension 09/23/2007  . NEPHROLITHIASIS, HX OF 09/23/2007  . Allergic rhinitis 07/14/2007    7:44 AM,08/31/17 Marylyn Ishihara PT, DPT [facility]  [phone]  [facility] [address] [place], [place], [address] Phone: [phone]   Fax:  [phone]  Name: SHAMMOND ARAVE MRN: [id number] Date of Birth: [birth year]

## 2017-09-01 ENCOUNTER — Ambulatory Visit: Payer: No Typology Code available for payment source | Admitting: Physical Therapy

## 2017-09-01 DIAGNOSIS — M6281 Muscle weakness (generalized): Secondary | ICD-10-CM

## 2017-09-01 DIAGNOSIS — M62830 Muscle spasm of back: Secondary | ICD-10-CM

## 2017-09-01 DIAGNOSIS — R293 Abnormal posture: Secondary | ICD-10-CM

## 2017-09-01 DIAGNOSIS — M545 Low back pain: Principal | ICD-10-CM

## 2017-09-01 DIAGNOSIS — G8929 Other chronic pain: Secondary | ICD-10-CM

## 2017-09-01 NOTE — Therapy (Signed)
[facility] [address] [place], [place], [address] Phone: [phone]   Fax:  [phone]  Physical Therapy Treatment  Patient Details  Name: Jimmy Holmes MRN: [id number] Date of Birth: 11/15/1961 Referring Provider: Kelle Darting, MD   Encounter Date: 09/01/2017      PT End of Session - 09/01/17 1803    Visit Number 2   Number of Visits 17   Date for PT Re-Evaluation 10/25/17   Authorization Type UHC   Authorization Time Period 08/30/17 to 10/25/17   PT Start Time 1620   PT Stop Time 1702   PT Time Calculation (min) 42 min   Activity Tolerance Patient tolerated treatment well;No increased pain   Behavior During Therapy WFL for tasks assessed/performed      Past Medical History:  Diagnosis Date  . ADHD (attention deficit hyperactivity disorder)   . Allergy   . Asthma   . Chronic kidney disease   . ED (erectile dysfunction)   . Hyperlipidemia   . Migraine     No past surgical history on file.  There were no vitals filed for this visit.      Subjective Assessment - 09/01/17 1634    Subjective Pt reports that he has not noticed much change in his symptoms with completing the pressups. He has some questions about the set up of his lumbar support. He did not have to work today.    Diagnostic tests none for lumbar spine    Patient Stated Goals decrease pain with activity   Currently in Pain? Yes   Pain Score 4    Pain Location Back   Pain Orientation Right;Lower   Pain Descriptors / Indicators Aching;Dull   Pain Type Chronic pain   Pain Onset More than a month ago                         [facility] Adult PT Treatment/Exercise - 09/01/17 0001      Exercises   Exercises Lumbar     Lumbar Exercises: Supine   Dead Bug 10 reps   Dead Bug Limitations each side   Large Ball Oblique Isometric 15 reps;2 seconds   Large Ball Oblique Isometric Limitations red physioball    Other Supine  Lumbar Exercises low trunk rotation x10 reps Lt and Rt    Other Supine Lumbar Exercises BUE press into red physioball in hooklying position x10 reps      Lumbar Exercises: Quadruped   Madcat/Old Horse 15 reps   Other Quadruped Lumbar Exercises quadruped hold with external perturbations 3x30 sec                 PT Education - 09/01/17 1646    Education provided Yes   Education Details reviewed use of lumbar roll; differences between using superficial vs deep abdominals during activity; updated and reviewed HEP    Person(s) Educated Patient   Methods Explanation;Demonstration;Tactile cues;Verbal cues;Handout   Comprehension Verbalized understanding;Returned demonstration          PT Short Term Goals - 08/30/17 2002      PT SHORT TERM GOAL #1   Title Pt will demo consistency and independence with his HEP to increase his trunk stability and decrease pain.    Time 4   Period Weeks   Status New   Target Date 09/27/17     PT SHORT TERM GOAL #2   Title Pt will demo proper set up and consistent  use of a lumbar roll to improve his seated posture and decrease low back pain.    Time 4   Period Weeks   Status New     PT SHORT TERM GOAL #3   Title Pt will report atleast 30% improvement in his symptoms from the start of PT, to reflect an increase in his daily activity tolerance.    Time 4   Period Weeks   Status New           PT Long Term Goals - 08/30/17 2022      PT LONG TERM GOAL #1   Title Pt will be able to complete quadruped opposite UE/LE reach for atleast 5 consecutive reps withotu LOB or excessive trunk rotation to demonstrate improved trunk endurance and stability with crawling activity at work.    Time 8   Period Weeks   Status New   Target Date 10/25/17     PT LONG TERM GOAL #2   Title Pt will report decrease in max pain raiting to no greater than 4/10 with activity around his home, to increase his activity participation.    Time 8   Period Weeks    Status New     PT LONG TERM GOAL #3   Title Pt will demo proper lifting mechanics with objects up to 50# atleast 3/5 reps, which will improve his safety at work.    Time 8   Period Weeks   Status New     PT LONG TERM GOAL #4   Title Pt will report atleast a 60% improvement in his low back pain and symptoms from the start of PT to increase his quality of life and improve his ability to complete his work activity.    Time 8   Period Weeks   Status New               Plan - 09/01/17 1803    Clinical Impression Statement Pt arrived with questions regarding his HEP provided at the evaluation. Therapist reviewed this with the pt, providing several options for improved comfort and set up. Remainder of the session focused on deep abdominal strengthening. Pt requires moderate cuing to improve his technique with several of today's exercises, secondary to poor neuromuscular control and strength of the obliques and transverse abdominus. Ended session without increase in pain, pt demonstrating understanding of all HEP additions at this time.    Rehab Potential Good   PT Frequency 2x / week   PT Duration 8 weeks   PT Treatment/Interventions ADLs/Self Care Home Management;Electrical Stimulation;Cryotherapy;Moist Heat;Traction;Therapeutic exercise;Therapeutic activities;Functional mobility training;Balance training;Neuromuscular re-education;Patient/family education;Manual techniques;Dry needling;Taping   PT Next Visit Plan follow up on use of lumbar roll; progression of lumbar stabilization exercise; manual techniques and modailites as needed to decrease pain    PT Home Exercise Plan lumbar roll; lifting mechanics; deadbug, hooklying oblique isometric, low trunk rotation to the Rt   Consulted and Agree with Plan of Care Patient      Patient will benefit from skilled therapeutic intervention in order to improve the following deficits and impairments:  Decreased range of motion, Decreased strength,  Hypomobility, Improper body mechanics, Pain, Postural dysfunction, Impaired flexibility, Increased muscle spasms  Visit Diagnosis: Chronic right-sided low back pain, with sciatica presence unspecified  Muscle spasm of back  Muscle weakness (generalized)  Abnormal posture     Problem List Patient Active Problem List   Diagnosis Date Noted  . Decreased libido 08/23/2017  . Chronic right-sided low back pain  without sciatica 08/23/2017  . Routine general medical examination at a health care facility 08/23/2017  . Hx of migraine headaches 09/29/2012  . Erectile dysfunction 11/11/2011  . Hyperlipidemia 09/17/2008  . Bipolar affective disorder (HCC) 09/17/2008  . Essential hypertension 09/23/2007  . NEPHROLITHIASIS, HX OF 09/23/2007  . Allergic rhinitis 07/14/2007    6:17 PM,09/01/17 Marylyn Ishihara PT, DPT [facility]  [phone]  [facility] [address] [place], [place], [address] Phone: [phone]   Fax:  [phone]  Name: Jimmy Holmes MRN: [id number] Date of Birth: May 08, 1961

## 2017-09-16 ENCOUNTER — Ambulatory Visit: Payer: No Typology Code available for payment source | Admitting: Physical Therapy

## 2017-09-16 DIAGNOSIS — M6281 Muscle weakness (generalized): Secondary | ICD-10-CM

## 2017-09-16 DIAGNOSIS — R293 Abnormal posture: Secondary | ICD-10-CM

## 2017-09-16 DIAGNOSIS — M545 Low back pain: Secondary | ICD-10-CM | POA: Diagnosis not present

## 2017-09-16 DIAGNOSIS — G8929 Other chronic pain: Secondary | ICD-10-CM

## 2017-09-16 DIAGNOSIS — M62830 Muscle spasm of back: Secondary | ICD-10-CM

## 2017-09-16 NOTE — Patient Instructions (Signed)

## 2017-09-17 NOTE — Therapy (Signed)
[facility] [address] [place], [place], [address] Phone: [phone]   Fax:  [phone]  Physical Therapy Treatment  Patient Details  Name: Jimmy BoxRandall J Capili MRN: [id number] Date of Birth: 12/20/1960 Referring Provider: Kelle DartingJeffrey Todd, MD   Encounter Date: 09/16/2017      PT End of Session - 09/17/17 0802    Visit Number 3   Number of Visits 17   Date for PT Re-Evaluation 10/25/17   Authorization Type UHC   Authorization Time Period 08/30/17 to 10/25/17   PT Start Time 1616   PT Stop Time 1702   PT Time Calculation (min) 46 min   Activity Tolerance Patient tolerated treatment well;No increased pain   Behavior During Therapy WFL for tasks assessed/performed      Past Medical History:  Diagnosis Date  . ADHD (attention deficit hyperactivity disorder)   . Allergy   . Asthma   . Chronic kidney disease   . ED (erectile dysfunction)   . Hyperlipidemia   . Migraine     No past surgical history on file.  There were no vitals filed for this visit.      Subjective Assessment - 09/16/17 1618    Subjective Pt reports no change in his symptoms since his last session. He still has some issues with the pain in his mid back when using his towel roll. His pain is not quite the level of a kidney stone, but it is higher than normal because he was doing alot at work.    Diagnostic tests none for lumbar spine    Patient Stated Goals decrease pain with activity   Currently in Pain? Yes   Pain Score 7    Pain Location Back   Pain Orientation Right;Lower   Pain Descriptors / Indicators Aching;Dull   Pain Type Chronic pain   Pain Onset More than a month ago   Pain Relieving Factors laying on his side, laying on his back                          [facility] Adult PT Treatment/Exercise - 09/17/17 0001      Exercises   Exercises Lumbar     Lumbar Exercises: Stretches   Single Knee to Chest Stretch 2  reps;10 seconds   Double Knee to Chest Stretch 3 reps;10 seconds     Lumbar Exercises: Supine                   Other Supine Lumbar Exercises low trunk rotation x15 reps Lt and Rt    Other Supine Lumbar Exercises --                  Manual Therapy   Manual Therapy Joint mobilization;Soft tissue mobilization   Joint Mobilization Grade III CPAs L2-L5   Soft tissue mobilization STM Rt lumbar paraspinals, Rt proximal glute max, Rt QL           Trigger Point Dry Needling - 09/17/17 0815    Consent Given? Yes   Education Handout Provided Yes   Muscles Treated Upper Body Longissimus  Rt longissimus and multifidi L4, L5   Longissimus Response Twitch response elicited;Palpable increased muscle length              PT Education - 09/17/17 0736    Education provided Yes   Education Details reviewed PT POC; discussed importance of addressing pain and core strength in order to  deal with the demands of his job; implications and side effects of dry needling   Person(s) Educated Patient   Methods Explanation;Handout   Comprehension Verbalized understanding          PT Short Term Goals - 09/17/17 0809      PT SHORT TERM GOAL #1   Title Pt will demo consistency and independence with his HEP to increase his trunk stability and decrease pain.    Time 4   Period Weeks   Status Achieved     PT SHORT TERM GOAL #2   Title Pt will demo proper set up and consistent use of a lumbar roll to improve his seated posture and decrease low back pain.    Time 4   Period Weeks   Status Achieved     PT SHORT TERM GOAL #3   Title Pt will report atleast 30% improvement in his symptoms from the start of PT, to reflect an increase in his daily activity tolerance.    Time 4   Period Weeks   Status On-going           PT Long Term Goals - 09/17/17 0809      PT LONG TERM GOAL #1   Title Pt will be able to complete quadruped opposite UE/LE reach for atleast 5 consecutive reps withotu  LOB or excessive trunk rotation to demonstrate improved trunk endurance and stability with crawling activity at work.    Time 8   Period Weeks   Status On-going     PT LONG TERM GOAL #2   Title Pt will report decrease in max pain raiting to no greater than 4/10 with activity around his home, to increase his activity participation.    Time 8   Period Weeks   Status On-going     PT LONG TERM GOAL #3   Title Pt will demo proper lifting mechanics with objects up to 50# atleast 3/5 reps, which will improve his safety at work.    Time 8   Period Weeks   Status On-going     PT LONG TERM GOAL #4   Title Pt will report atleast a 60% improvement in his low back pain and symptoms from the start of PT to increase his quality of life and improve his ability to complete his work activity.    Time 8   Period Weeks   Status On-going               Plan - 09/17/17 0802    Clinical Impression Statement Pt arrived with frustrations over his lack of progress. At this time, the pt has only received one treatment session over the past several weeks which makes it difficult to provide necessary treatment to decrease pain/spasm. Therapist discussed this with the pt and educated him on expected recovery times considering the heavy activity he completes at work. Also completed manual techniques and dry needling treatment to the lumbar region, noting palpable decrease in muscle tension following this. Pt responded well to this treatment, reporting decrease in his pain rating from 7/10 to "less than 5/10". Will plan to update pt's HEP and continue with PT interventions at his next session in order to further decrease his pain and improved core stability and endurance   Rehab Potential Good   PT Frequency 2x / week   PT Duration 8 weeks   PT Treatment/Interventions ADLs/Self Care Home Management;Electrical Stimulation;Cryotherapy;Moist Heat;Traction;Therapeutic exercise;Therapeutic activities;Functional  mobility training;Balance training;Neuromuscular re-education;Patient/family education;Manual techniques;Dry needling;Taping  PT Next Visit Plan follow up on dry needling response; progression of lumbar stabilization exercise (dead bug, prone multifidi activation); manual techniques and modailites as needed to decrease pain    PT Home Exercise Plan lumbar roll; lifting mechanics; deadbug, hooklying oblique isometric, low trunk rotation to the Rt   Consulted and Agree with Plan of Care Patient      Patient will benefit from skilled therapeutic intervention in order to improve the following deficits and impairments:  Decreased range of motion, Decreased strength, Hypomobility, Improper body mechanics, Pain, Postural dysfunction, Impaired flexibility, Increased muscle spasms  Visit Diagnosis: Chronic right-sided low back pain, with sciatica presence unspecified  Muscle spasm of back  Muscle weakness (generalized)  Abnormal posture     Problem List Patient Active Problem List   Diagnosis Date Noted  . Decreased libido 08/23/2017  . Chronic right-sided low back pain without sciatica 08/23/2017  . Routine general medical examination at a health care facility 08/23/2017  . Hx of migraine headaches 09/29/2012  . Erectile dysfunction 11/11/2011  . Hyperlipidemia 09/17/2008  . Bipolar affective disorder (HCC) 09/17/2008  . Essential hypertension 09/23/2007  . NEPHROLITHIASIS, HX OF 09/23/2007  . Allergic rhinitis 07/14/2007    8:17 AM,09/17/17 Marylyn Ishihara PT, DPT [facility]  [phone]  [facility] [address] [place], [place], [address] Phone: [phone]   Fax:  [phone]  Name: LAMOUNT BANKSON MRN: [id number] Date of Birth: 07-18-1961

## 2017-09-20 ENCOUNTER — Ambulatory Visit: Payer: No Typology Code available for payment source | Admitting: Physical Therapy

## 2017-09-20 DIAGNOSIS — M545 Low back pain: Principal | ICD-10-CM

## 2017-09-20 DIAGNOSIS — M62830 Muscle spasm of back: Secondary | ICD-10-CM

## 2017-09-20 DIAGNOSIS — R293 Abnormal posture: Secondary | ICD-10-CM

## 2017-09-20 DIAGNOSIS — G8929 Other chronic pain: Secondary | ICD-10-CM

## 2017-09-20 DIAGNOSIS — M6281 Muscle weakness (generalized): Secondary | ICD-10-CM

## 2017-09-20 NOTE — Therapy (Signed)
[facility] [address], [address] [place], [place], [address] Phone: [phone]   Fax:  [phone]  Physical Therapy Treatment  Patient Details  Name: Jimmy Holmes MRN: [id number] Date of Birth: [birth year] Referring Provider: Kelle Darting, MD   Encounter Date: 09/20/2017      PT End of Session - 09/20/17 1707    Visit Number 4   Number of Visits 17   Date for PT Re-Evaluation 10/25/17   Authorization Type UHC   Authorization Time Period 08/30/17 to 10/25/17   PT Start Time 1615   PT Stop Time 1700   PT Time Calculation (min) 45 min   Activity Tolerance Patient tolerated treatment well;No increased pain   Behavior During Therapy WFL for tasks assessed/performed      Past Medical History:  Diagnosis Date  . ADHD (attention deficit hyperactivity disorder)   . Allergy   . Asthma   . Chronic kidney disease   . ED (erectile dysfunction)   . Hyperlipidemia   . Migraine     No past surgical history on file.  There were no vitals filed for this visit.      Subjective Assessment - 09/20/17 1618    Subjective Pt reports that things are going well. He reports that he was sore for about 2 days following his session however following that, he felt the best he has in a long while. He continues to complete his exercises at home.    Diagnostic tests none for lumbar spine    Patient Stated Goals decrease pain with activity   Currently in Pain? Yes   Pain Score 4    Pain Location Back   Pain Orientation Right;Lower   Pain Descriptors / Indicators Dull;Aching   Pain Type Chronic pain   Pain Onset More than a month ago   Pain Frequency Constant                         [facility] Adult PT Treatment/Exercise - 09/20/17 0001      Lumbar Exercises: Supine   Dead Bug 10 reps   Dead Bug Limitations 1 set knees bent, 2nd set knees extended    Other Supine Lumbar Exercises Bent knee raise with green TB around  knees and ball squeeze between ankles x15 reps      Lumbar Exercises: Sidelying   Clam 15 reps   Clam Limitations green TB, with abdominal bracing      Lumbar Exercises: Prone   Other Prone Lumbar Exercises attempted plank on elbows/feet, but pt unable   Other Prone Lumbar Exercises plank on elbows and knees 5x10 sec hold, pt requiring heavy cuing for this activity      Manual Therapy   Joint Mobilization Grade III CPAs mid thoracic through lumbar spine    Soft tissue mobilization STM Rt lumbar paraspinals, Rt glute max                 PT Education - 09/20/17 1703    Education provided Yes   Education Details technique with therex; importance of being able to activate deep abdominals in various positions, especially the positions required in his job; updated and reviewed HEP    Person(s) Educated Patient   Methods Explanation;Handout   Comprehension Verbalized understanding;Returned demonstration          PT Short Term Goals - 09/17/17 0809      PT SHORT TERM GOAL #1   Title Pt  will demo consistency and independence with his HEP to increase his trunk stability and decrease pain.    Time 4   Period Weeks   Status Achieved     PT SHORT TERM GOAL #2   Title Pt will demo proper set up and consistent use of a lumbar roll to improve his seated posture and decrease low back pain.    Time 4   Period Weeks   Status Achieved     PT SHORT TERM GOAL #3   Title Pt will report atleast 30% improvement in his symptoms from the start of PT, to reflect an increase in his daily activity tolerance.    Time 4   Period Weeks   Status On-going           PT Long Term Goals - 09/17/17 0809      PT LONG TERM GOAL #1   Title Pt will be able to complete quadruped opposite UE/LE reach for atleast 5 consecutive reps withotu LOB or excessive trunk rotation to demonstrate improved trunk endurance and stability with crawling activity at work.    Time 8   Period Weeks   Status  On-going     PT LONG TERM GOAL #2   Title Pt will report decrease in max pain raiting to no greater than 4/10 with activity around his home, to increase his activity participation.    Time 8   Period Weeks   Status On-going     PT LONG TERM GOAL #3   Title Pt will demo proper lifting mechanics with objects up to 50# atleast 3/5 reps, which will improve his safety at work.    Time 8   Period Weeks   Status On-going     PT LONG TERM GOAL #4   Title Pt will report atleast a 60% improvement in his low back pain and symptoms from the start of PT to increase his quality of life and improve his ability to complete his work activity.    Time 8   Period Weeks   Status On-going               Plan - 09/20/17 1840    Clinical Impression Statement Pt appears to be making progress towards decreased pain following last session. Focus of today was on updating his HEP to address deep abdominal activation and endurance during various position changes. Pt demonstrates increased difficulty with maintaining proper activation when in the prone position, evident by his need for increased cuing during planking activity. He would continue to benefit from skilled PT to facilitate safe completion of work activity.    Rehab Potential Good   PT Frequency 2x / week   PT Duration 8 weeks   PT Treatment/Interventions ADLs/Self Care Home Management;Electrical Stimulation;Cryotherapy;Moist Heat;Traction;Therapeutic exercise;Therapeutic activities;Functional mobility training;Balance training;Neuromuscular re-education;Patient/family education;Manual techniques;Dry needling;Taping   PT Next Visit Plan dry needling; sidelying and prone abdominal activation; progression of lumbar stabilization exercise (dead bug, prone multifidi activation); manual techniques and modailites as needed to decrease pain    PT Home Exercise Plan lumbar roll; lifting mechanics; deadbug straight leg, plank on elbows/knees, sidelying clams     Recommended Other Services MD orders signed   Consulted and Agree with Plan of Care Patient      Patient will benefit from skilled therapeutic intervention in order to improve the following deficits and impairments:  Decreased range of motion, Decreased strength, Hypomobility, Improper body mechanics, Pain, Postural dysfunction, Impaired flexibility, Increased muscle spasms  Visit Diagnosis:  Chronic right-sided low back pain, with sciatica presence unspecified  Muscle spasm of back  Muscle weakness (generalized)  Abnormal posture     Problem List Patient Active Problem List   Diagnosis Date Noted  . Decreased libido 08/23/2017  . Chronic right-sided low back pain without sciatica 08/23/2017  . Routine general medical examination at a health care facility 08/23/2017  . Hx of migraine headaches 09/29/2012  . Erectile dysfunction 11/11/2011  . Hyperlipidemia 09/17/2008  . Bipolar affective disorder (HCC) 09/17/2008  . Essential hypertension 09/23/2007  . NEPHROLITHIASIS, HX OF 09/23/2007  . Allergic rhinitis 07/14/2007   6:45 PM,09/20/17 Marylyn IshiharaSara Kiser PT, DPT [facility]  [phone]  [facility] [address], [address] [place], [place], [address] Phone: [phone]   Fax:  [phone]  Name: Audie BoxRandall J Kuck MRN: [id number] Date of Birth: 11/01/1961

## 2017-09-20 NOTE — Patient Instructions (Addendum)
  ELASTIC BAND - SIDELYING CLAM - CLAMSHELL   While lying on your side with your knees bent and an elastic band wrapped around your knees, draw up the top knee while keeping contact of your feet together as shown.   Do not let your pelvis roll back during the lifting movement.      x20 reps each side, avoid crunching to the side.    Supine Deadbug  Laying on your back with arms and legs extended toward the ceiling, slowly lower the left leg and right arm at the same time. Return to neutral and repeat for the right leg and left arm.   Hold 3 seconds, repeat 10x on each side.     PLANK - KNEES  While lying face down, lift your body up on your elbows and knees. Try and maintain a straight spine. Hold 10 sec, repeat 5 times     [facility] [address], [address] [place], [place] [address] Phone # [phone] Fax [phone]

## 2017-09-22 ENCOUNTER — Ambulatory Visit: Payer: No Typology Code available for payment source | Admitting: Physical Therapy

## 2017-09-22 DIAGNOSIS — G8929 Other chronic pain: Secondary | ICD-10-CM

## 2017-09-22 DIAGNOSIS — M6281 Muscle weakness (generalized): Secondary | ICD-10-CM

## 2017-09-22 DIAGNOSIS — R293 Abnormal posture: Secondary | ICD-10-CM

## 2017-09-22 DIAGNOSIS — M545 Low back pain: Principal | ICD-10-CM

## 2017-09-22 DIAGNOSIS — M62830 Muscle spasm of back: Secondary | ICD-10-CM

## 2017-09-22 NOTE — Therapy (Signed)
[facility] [address], [address] [place], [place], [address] Phone: [phone]   Fax:  [phone]  Physical Therapy Treatment  Patient Details  Name: Jimmy BoxRandall J Neuberger MRN: [id number] Date of Birth: 02/25/1961 Referring Provider: Kelle DartingJeffrey Todd, MD   Encounter Date: 09/22/2017      PT End of Session - 09/22/17 1623    Visit Number 5   Number of Visits 17   Date for PT Re-Evaluation 10/25/17   Authorization Type UHC   Authorization Time Period 08/30/17 to 10/25/17   PT Start Time 1614   PT Stop Time 1658   PT Time Calculation (min) 44 min   Activity Tolerance Patient tolerated treatment well;No increased pain   Behavior During Therapy WFL for tasks assessed/performed      Past Medical History:  Diagnosis Date  . ADHD (attention deficit hyperactivity disorder)   . Allergy   . Asthma   . Chronic kidney disease   . ED (erectile dysfunction)   . Hyperlipidemia   . Migraine     No past surgical history on file.  There were no vitals filed for this visit.      Subjective Assessment - 09/22/17 1614    Subjective Pt reports that today was a little rough in the morning, he had to go into a pretty tight crawl space. He did complete his new exercises since his last session.    Diagnostic tests none for lumbar spine    Patient Stated Goals decrease pain with activity   Currently in Pain? Yes   Pain Score 5   dropped to 3/10 standing end of session, 0/10 sitting   Pain Location Back   Pain Orientation Right;Lower   Pain Descriptors / Indicators Aching;Dull   Pain Type Chronic pain   Pain Onset More than a month ago   Pain Frequency Constant   Aggravating Factors  alot of crawling and heavy work    Pain Relieving Factors laying on his back    Effect of Pain on Daily Activities moderate, still able to push through the pain                          [facility] Adult PT Treatment/Exercise - 09/22/17 0001       Lumbar Exercises: Stretches   Double Knee to Chest Stretch 3 reps;10 seconds   Piriformis Stretch 4 reps;10 seconds   Piriformis Stretch Limitations knee to opposite shoulder      Lumbar Exercises: Supine   Bridge 10 reps   Other Supine Lumbar Exercises low trunk rotation with LE on red physioball x10 reps Lt/Rt      Manual Therapy   Soft tissue mobilization STM Rt lumbar paraspinals, Rt glute max, Rt QL release           Trigger Point Dry Needling - 09/22/17 1622    Consent Given? Yes   Muscles Treated Upper Body Longissimus   Longissimus Response Palpable increased muscle length;Twitch response elicited  Rt L4/L5 multifidi with twitch response elicited              PT Education - 09/22/17 2144    Education provided Yes   Education Details benefits of manual techniques to decrease pain and therex to address limitations in preventing return of pain/symptoms with daily activity; answered pt questions regarding estim and its benefits    Person(s) Educated Patient   Methods Explanation   Comprehension Verbalized understanding  PT Short Term Goals - 09/17/17 0809      PT SHORT TERM GOAL #1   Title Pt will demo consistency and independence with his HEP to increase his trunk stability and decrease pain.    Time 4   Period Weeks   Status Achieved     PT SHORT TERM GOAL #2   Title Pt will demo proper set up and consistent use of a lumbar roll to improve his seated posture and decrease low back pain.    Time 4   Period Weeks   Status Achieved     PT SHORT TERM GOAL #3   Title Pt will report atleast 30% improvement in his symptoms from the start of PT, to reflect an increase in his daily activity tolerance.    Time 4   Period Weeks   Status On-going           PT Long Term Goals - 09/17/17 0809      PT LONG TERM GOAL #1   Title Pt will be able to complete quadruped opposite UE/LE reach for atleast 5 consecutive reps withotu LOB or excessive  trunk rotation to demonstrate improved trunk endurance and stability with crawling activity at work.    Time 8   Period Weeks   Status On-going     PT LONG TERM GOAL #2   Title Pt will report decrease in max pain raiting to no greater than 4/10 with activity around his home, to increase his activity participation.    Time 8   Period Weeks   Status On-going     PT LONG TERM GOAL #3   Title Pt will demo proper lifting mechanics with objects up to 50# atleast 3/5 reps, which will improve his safety at work.    Time 8   Period Weeks   Status On-going     PT LONG TERM GOAL #4   Title Pt will report atleast a 60% improvement in his low back pain and symptoms from the start of PT to increase his quality of life and improve his ability to complete his work activity.    Time 8   Period Weeks   Status On-going               Plan - 09/22/17 2146    Clinical Impression Statement Pt continues to respond will to manual techniques of STM and dry needling with palpable increase in muscle length as well as consistent decrease in Rt low back pain following his sessions. Pt does still demonstrate poor trunk stability and will continue to benefit from therex to address this in order to prevent excessive spinal extensor activation during daily activity.    Rehab Potential Good   PT Frequency 2x / week   PT Duration 8 weeks   PT Treatment/Interventions ADLs/Self Care Home Management;Electrical Stimulation;Cryotherapy;Moist Heat;Traction;Therapeutic exercise;Therapeutic activities;Functional mobility training;Balance training;Neuromuscular re-education;Patient/family education;Manual techniques;Dry needling;Taping   PT Next Visit Plan dry needling as needed to lumbar spine QL, multifidi; sidelying and prone deep abdominal activation; manual techniques and modailites as needed to decrease pain    PT Home Exercise Plan lumbar roll; lifting mechanics; deadbug straight leg, plank on elbows/knees,  sidelying clams    Consulted and Agree with Plan of Care Patient      Patient will benefit from skilled therapeutic intervention in order to improve the following deficits and impairments:  Decreased range of motion, Decreased strength, Hypomobility, Improper body mechanics, Pain, Postural dysfunction, Impaired flexibility, Increased muscle spasms  Visit Diagnosis: Chronic right-sided low back pain, with sciatica presence unspecified  Muscle spasm of back  Muscle weakness (generalized)  Abnormal posture     Problem List Patient Active Problem List   Diagnosis Date Noted  . Decreased libido 08/23/2017  . Chronic right-sided low back pain without sciatica 08/23/2017  . Routine general medical examination at a health care facility 08/23/2017  . Hx of migraine headaches 09/29/2012  . Erectile dysfunction 11/11/2011  . Hyperlipidemia 09/17/2008  . Bipolar affective disorder (HCC) 09/17/2008  . Essential hypertension 09/23/2007  . NEPHROLITHIASIS, HX OF 09/23/2007  . Allergic rhinitis 07/14/2007    9:51 PM,09/22/17 Marylyn Ishihara PT, DPT [facility]  [phone]  [facility] [address], [address] [place], [place], [address] Phone: [phone]   Fax:  [phone]  Name: KEONTAE LEVINGSTON MRN: [id number] Date of Birth: 1961/08/22

## 2017-09-29 ENCOUNTER — Ambulatory Visit: Payer: No Typology Code available for payment source | Attending: Family Medicine | Admitting: Physical Therapy

## 2017-09-29 DIAGNOSIS — R293 Abnormal posture: Secondary | ICD-10-CM | POA: Diagnosis present

## 2017-09-29 DIAGNOSIS — G8929 Other chronic pain: Secondary | ICD-10-CM | POA: Insufficient documentation

## 2017-09-29 DIAGNOSIS — M62830 Muscle spasm of back: Secondary | ICD-10-CM | POA: Diagnosis present

## 2017-09-29 DIAGNOSIS — M6281 Muscle weakness (generalized): Secondary | ICD-10-CM | POA: Insufficient documentation

## 2017-09-29 DIAGNOSIS — M545 Low back pain: Secondary | ICD-10-CM | POA: Insufficient documentation

## 2017-09-29 NOTE — Therapy (Signed)
[facility] [address], [address] [place], [place], [address] Phone: [phone]   Fax:  [phone]  Physical Therapy Treatment  Patient Details  Name: Jimmy Holmes MRN: [id number] Date of Birth: 06-09-1961 Referring Provider: Kelle Darting, MD    Encounter Date: 09/29/2017  PT End of Session - 09/29/17 2106    Visit Number  6    Number of Visits  17    Date for PT Re-Evaluation  10/25/17    Authorization Type  UHC    Authorization Time Period  08/30/17 to 10/25/17    PT Start Time  1615    PT Stop Time  1705    PT Time Calculation (min)  50 min    Activity Tolerance  Patient tolerated treatment well;No increased pain    Behavior During Therapy  WFL for tasks assessed/performed       Past Medical History:  Diagnosis Date  . ADHD (attention deficit hyperactivity disorder)   . Allergy   . Asthma   . Chronic kidney disease   . ED (erectile dysfunction)   . Hyperlipidemia   . Migraine     No past surgical history on file.  There were no vitals filed for this visit.  Subjective Assessment - 09/29/17 1619    Subjective  Pt reports that things are going well. He has no issues currently, but he did have some pain in his low back yesterday following working in the crawl space.     Diagnostic tests  none for lumbar spine     Patient Stated Goals  decrease pain with activity    Currently in Pain?  No/denies no pain, i just know it's there   no pain, i just know it's there   Pain Onset  More than a month ago                      [facility] Adult PT Treatment/Exercise - 09/29/17 0001      Exercises   Exercises  Other Exercises    Other Exercises   tall kneel tripod hold 2x30 sec; Standing hip adductor stretch 2x30 sec each, butterfly stretch 2x30 sec       Lumbar Exercises: Prone   Other Prone Lumbar Exercises  incline plank on elbows 3x15 sec hold on knees, hold on elbows/feet 4x10 sec     Other Prone  Lumbar Exercises  incline plank on elbows/feet with LE extension 10x3 sec hold on each side       Lumbar Exercises: Quadruped   Other Quadruped Lumbar Exercises  quadruped firehydrant with 3 sec hold, x10 reps each side              PT Education - 09/29/17 2104    Education provided  Yes    Education Details  technique with therex; updated and reviewed HEP; discussed pt concerns regarding copay and decreasing PT frequency; importance of maintaining deep abdominal activation throughout the day rather than just during therex completed during the day and at sessions     Person(s) Educated  Patient    Methods  Explanation;Verbal cues;Tactile cues;Demonstration;Handout    Comprehension  Returned demonstration;Verbalized understanding       PT Short Term Goals - 09/17/17 0809      PT SHORT TERM GOAL #1   Title  Pt will demo consistency and independence with his HEP to increase his trunk stability and decrease pain.     Time  4  Period  Weeks    Status  Achieved      PT SHORT TERM GOAL #2   Title  Pt will demo proper set up and consistent use of a lumbar roll to improve his seated posture and decrease low back pain.     Time  4    Period  Weeks    Status  Achieved      PT SHORT TERM GOAL #3   Title  Pt will report atleast 30% improvement in his symptoms from the start of PT, to reflect an increase in his daily activity tolerance.     Time  4    Period  Weeks    Status  On-going        PT Long Term Goals - 09/17/17 0809      PT LONG TERM GOAL #1   Title  Pt will be able to complete quadruped opposite UE/LE reach for atleast 5 consecutive reps withotu LOB or excessive trunk rotation to demonstrate improved trunk endurance and stability with crawling activity at work.     Time  8    Period  Weeks    Status  On-going      PT LONG TERM GOAL #2   Title  Pt will report decrease in max pain raiting to no greater than 4/10 with activity around his home, to increase his  activity participation.     Time  8    Period  Weeks    Status  On-going      PT LONG TERM GOAL #3   Title  Pt will demo proper lifting mechanics with objects up to 50# atleast 3/5 reps, which will improve his safety at work.     Time  8    Period  Weeks    Status  On-going      PT LONG TERM GOAL #4   Title  Pt will report atleast a 60% improvement in his low back pain and symptoms from the start of PT to increase his quality of life and improve his ability to complete his work activity.     Time  8    Period  Weeks    Status  On-going            Plan - 09/29/17 1712    Clinical Impression Statement  Pt is making steady progress towards his goals, reporting no pain upon arrival despite a full day of work prior to his session. Pt has concerns with his ability to manage time off of work and copays for his sessions, so therapist discussed decreasing PT frequency with HEP updates made to progress his strengthening and flexibility. Pt was able to complete plank hold with improved abdominal activation and reported no pain following progressions of today's exercises. Will continue to progress LE flexibility and trunk endurance/strength for improve pain during the day.     Rehab Potential  Good    PT Frequency  2x / week    PT Duration  8 weeks    PT Treatment/Interventions  ADLs/Self Care Home Management;Electrical Stimulation;Cryotherapy;Moist Heat;Traction;Therapeutic exercise;Therapeutic activities;Functional mobility training;Balance training;Neuromuscular re-education;Patient/family education;Manual techniques;Dry needling;Taping    PT Next Visit Plan  dry needling as needed to lumbar spine QL, multifidi; prone deep abdominal activation with hip ext; tall kneel deal lift, quad LE/UE reaches, manual techniques and modailites as needed to decrease pain     PT Home Exercise Plan  plank on elbows/toes (incline), progress to UE reach or LE reach; quadruped firehydrant,  seated butterfly  stretch, SKTC, DKTC    Consulted and Agree with Plan of Care  Patient       Patient will benefit from skilled therapeutic intervention in order to improve the following deficits and impairments:  Decreased range of motion, Decreased strength, Hypomobility, Improper body mechanics, Pain, Postural dysfunction, Impaired flexibility, Increased muscle spasms  Visit Diagnosis: Chronic right-sided low back pain, with sciatica presence unspecified  Muscle spasm of back  Muscle weakness (generalized)  Abnormal posture     Problem List Patient Active Problem List   Diagnosis Date Noted  . Decreased libido 08/23/2017  . Chronic right-sided low back pain without sciatica 08/23/2017  . Routine general medical examination at a health care facility 08/23/2017  . Hx of migraine headaches 09/29/2012  . Erectile dysfunction 11/11/2011  . Hyperlipidemia 09/17/2008  . Bipolar affective disorder (HCC) 09/17/2008  . Essential hypertension 09/23/2007  . NEPHROLITHIASIS, HX OF 09/23/2007  . Allergic rhinitis 07/14/2007   9:16 PM,09/29/17 Marylyn IshiharaSara Kiser PT, DPT [facility]  [phone]  [facility] [address], [address] [place], [place], [address] Phone: [phone]   Fax:  [phone]  Name: Audie BoxRandall J Heiler MRN: [id number] Date of Birth: 07/18/1961

## 2017-10-04 ENCOUNTER — Encounter: Payer: Self-pay | Admitting: Physical Therapy

## 2017-10-06 ENCOUNTER — Ambulatory Visit: Payer: No Typology Code available for payment source | Admitting: Physical Therapy

## 2017-10-06 DIAGNOSIS — R293 Abnormal posture: Secondary | ICD-10-CM

## 2017-10-06 DIAGNOSIS — M6281 Muscle weakness (generalized): Secondary | ICD-10-CM

## 2017-10-06 DIAGNOSIS — M545 Low back pain: Secondary | ICD-10-CM | POA: Diagnosis not present

## 2017-10-06 DIAGNOSIS — G8929 Other chronic pain: Secondary | ICD-10-CM

## 2017-10-06 DIAGNOSIS — M62830 Muscle spasm of back: Secondary | ICD-10-CM

## 2017-10-06 NOTE — Therapy (Addendum)
[facility] [address] [place], [place], [address] Phone: [phone]   Fax:  [phone]  Physical Therapy Treatment/discharge  Patient Details  Name: Jimmy Holmes MRN: [id number] Date of Birth: 10/26/1961 Referring Provider: Stevie Kern, MD    Encounter Date: 10/06/2017  PT End of Session - 10/06/17 1704    Visit Number  7    Number of Visits  17    Date for PT Re-Evaluation  10/25/17    Authorization Type  [employer]    Authorization Time Period  08/30/17 to 10/25/17    PT Start Time  7681    PT Stop Time  1700    PT Time Calculation (min)  43 min    Activity Tolerance  Patient tolerated treatment well;No increased pain    Behavior During Therapy  WFL for tasks assessed/performed       Past Medical History:  Diagnosis Date  . ADHD (attention deficit hyperactivity disorder)   . Allergy   . Asthma   . Chronic kidney disease   . ED (erectile dysfunction)   . Hyperlipidemia   . Migraine     No past surgical history on file.  There were no vitals filed for this visit.  Subjective Assessment - 10/06/17 1619    Subjective  Pt reports that things were going well, until he started a new job with a significantly low crawl space. He says this aggravated it, but he has been completing his exercises at home as instructed.     Diagnostic tests  none for lumbar spine     Patient Stated Goals  decrease pain with activity    Currently in Pain?  Yes    Pain Score  7     Pain Location  Back    Pain Orientation  Right;Lower    Pain Descriptors / Indicators  Aching;Dull    Pain Type  Chronic pain    Pain Onset  More than a month ago    Pain Frequency  Constant    Aggravating Factors   low crawl spaces    Pain Relieving Factors  laying on his back     Effect of Pain on Daily Activities  fluctuates                       [facility] Adult PT Treatment/Exercise - 10/06/17 0001      Lumbar Exercises:  Stretches   Double Knee to Chest Stretch  5 reps;10 seconds      Lumbar Exercises: Standing   Other Standing Lumbar Exercises  Rt hip adduction slide x10 reps       Manual Therapy   Soft tissue mobilization  STM Rt lumbar paraspinals, Rt glute max, Rt QL        Trigger Point Dry Needling - 10/06/17 1702    Muscles Treated Upper Body  Longissimus Rt L4/L5 multifidi    Longissimus Response  Twitch response elicited;Palpable increased muscle length           PT Education - 10/06/17 1702    Education provided  Yes    Education Details  discussed progressions of therex and improving stability in static vs dynamic positions for carry over with work related tasks; discussed modalities and use of self massage techniques at home when pt has flare up of symptoms    Person(s) Educated  Patient    Methods  Explanation    Comprehension  Verbalized understanding  PT Short Term Goals - 09/17/17 0809      PT SHORT TERM GOAL #1   Title  Pt will demo consistency and independence with his HEP to increase his trunk stability and decrease pain.     Time  4    Period  Weeks    Status  Achieved      PT SHORT TERM GOAL #2   Title  Pt will demo proper set up and consistent use of a lumbar roll to improve his seated posture and decrease low back pain.     Time  4    Period  Weeks    Status  Achieved      PT SHORT TERM GOAL #3   Title  Pt will report atleast 30% improvement in his symptoms from the start of PT, to reflect an increase in his daily activity tolerance.     Time  4    Period  Weeks    Status  On-going        PT Long Term Goals - 09/17/17 0809      PT LONG TERM GOAL #1   Title  Pt will be able to complete quadruped opposite UE/LE reach for atleast 5 consecutive reps withotu LOB or excessive trunk rotation to demonstrate improved trunk endurance and stability with crawling activity at work.     Time  8    Period  Weeks    Status  On-going      PT LONG TERM GOAL #2    Title  Pt will report decrease in max pain raiting to no greater than 4/10 with activity around his home, to increase his activity participation.     Time  8    Period  Weeks    Status  On-going      PT LONG TERM GOAL #3   Title  Pt will demo proper lifting mechanics with objects up to 50# atleast 3/5 reps, which will improve his safety at work.     Time  8    Period  Weeks    Status  On-going      PT LONG TERM GOAL #4   Title  Pt will report atleast a 60% improvement in his low back pain and symptoms from the start of PT to increase his quality of life and improve his ability to complete his work activity.     Time  8    Period  Weeks    Status  On-going            Plan - 10/06/17 1704    Clinical Impression Statement  Pt arrived reporting a minor setback in his progress with a difficult past 2 days at work. He continues to complete his exercises at home despite this. Therapist noted increased tenderness with palpation along the Rt side of the low lumbar spine and completed manual treatment in combination with dry needling, noting increased tissue length following this session. Pt reported a decrease in his low back discomfort from 7 down to a 5/10. Therapist encouraged pt to continue with exercise progressions provided at his last visit and educated him on expected progressions moving forward. Pt verbalized understanding at this time.     Rehab Potential  Good    PT Frequency  2x / week    PT Duration  8 weeks    PT Treatment/Interventions  ADLs/Self Care Home Management;Electrical Stimulation;Cryotherapy;Moist Heat;Traction;Therapeutic exercise;Therapeutic activities;Functional mobility training;Balance training;Neuromuscular re-education;Patient/family education;Manual techniques;Dry needling;Taping    PT Next  Visit Plan  dry needling as needed to lumbar spine QL, multifidi; prone deep abdominal activation with hip ext; tall kneel deal lift, quad LE/UE reaches, manual techniques and  modailites as needed to decrease pain     PT Home Exercise Plan  plank on elbows/toes (incline), progress to UE reach or LE reach; quadruped firehydrant, seated butterfly stretch, SKTC, DKTC    Consulted and Agree with Plan of Care  Patient       Patient will benefit from skilled therapeutic intervention in order to improve the following deficits and impairments:  Decreased range of motion, Decreased strength, Hypomobility, Improper body mechanics, Pain, Postural dysfunction, Impaired flexibility, Increased muscle spasms  Visit Diagnosis: Chronic right-sided low back pain, with sciatica presence unspecified  Muscle spasm of back  Muscle weakness (generalized)  Abnormal posture     Problem List Patient Active Problem List   Diagnosis Date Noted  . Decreased libido 08/23/2017  . Chronic right-sided low back pain without sciatica 08/23/2017  . Routine general medical examination at a health care facility 08/23/2017  . Hx of migraine headaches 09/29/2012  . Erectile dysfunction 11/11/2011  . Hyperlipidemia 09/17/2008  . Bipolar affective disorder ([place]) 09/17/2008  . Essential hypertension 09/23/2007  . NEPHROLITHIASIS, HX OF 09/23/2007  . Allergic rhinitis 07/14/2007    5:11 PM,10/06/17 Elly Modena PT, DPT [facility] [address] [place], [place], [address] Phone: [phone]   Fax:  [phone]  Name: IDRISSA BEVILLE MRN: [id number] Date of Birth: 12-19-1960    *addendum to resolve episode of care and d/c pt from PT  [place]  Visits from Start of Care: 7  Current functional level related to goals / functional outcomes: See above for more details    Remaining deficits: See above for more details    Education / Equipment: See above for more details   Plan: Patient agrees to discharge.  Patient goals  were partially met. Patient is being discharged due to financial reasons.  ?????    Pt has not returned since his last visit on 10/06/17   5:05 PM,12/16/17 Sherol Dade PT, [place] [facility] [place]

## 2017-10-11 ENCOUNTER — Encounter: Payer: Self-pay | Admitting: Physical Therapy

## 2017-10-18 ENCOUNTER — Ambulatory Visit: Payer: No Typology Code available for payment source | Admitting: Physical Therapy

## 2017-10-20 ENCOUNTER — Encounter: Payer: Self-pay | Admitting: Physical Therapy

## 2017-10-25 ENCOUNTER — Encounter: Payer: Self-pay | Admitting: Physical Therapy

## 2017-10-25 ENCOUNTER — Encounter: Payer: Self-pay | Admitting: Internal Medicine

## 2017-10-25 ENCOUNTER — Ambulatory Visit (INDEPENDENT_AMBULATORY_CARE_PROVIDER_SITE_OTHER): Payer: No Typology Code available for payment source | Admitting: Internal Medicine

## 2017-10-25 VITALS — BP 126/80 | HR 76 | Ht 68.0 in | Wt 160.0 lb

## 2017-10-25 DIAGNOSIS — Z1211 Encounter for screening for malignant neoplasm of colon: Secondary | ICD-10-CM

## 2017-10-25 NOTE — Patient Instructions (Signed)

## 2017-10-25 NOTE — Progress Notes (Signed)
   Jimmy Holmes 56 y.o. 07/23/1961 [id number]  Assessment & Plan:   Encounter Diagnosis  Name Primary?  . Colon cancer screening Yes    We reviewed the various options for colon cancer screening and the rationale for having it.  Stool based testing and colonoscopy were reviewed.  He has decided to have a screening colonoscopy.The risks and benefits as well as alternatives of endoscopic procedure(s) have been discussed and reviewed. All questions answered. The patient agrees to proceed.   Subjective:   Chief Complaint: Colon cancer screening, wants to discuss options  HPI The patient is here at the request of Dr. Tawanna Coolerodd, Dr. Tawanna Coolerodd has recommended a colonoscopy ever since he turned 52 but the patient has been extremely reluctant to do so.  He is not necessarily afraid of the procedure he just is not enthusiastic about having the colonoscope inserted through his anus into his rectum etc.  He does not have any signs or symptoms of GI problems.  There is no family history of colon cancer. No Known Allergies Current Meds  Medication Sig  . albuterol (PROVENTIL HFA;VENTOLIN HFA) 108 (90 Base) MCG/ACT inhaler Inhale 2 puffs into the lungs every 6 (six) hours as needed (for cough).  [name] Kitchen. aspirin 325 MG tablet Take 325 mg by mouth daily.    . cetirizine (ZYRTEC) 10 MG tablet Take 10 mg by mouth daily.    . chlorthalidone (HYGROTON) 25 MG tablet Take 1 tablet (25 mg total) by mouth daily.  [name] Kitchen. lamoTRIgine (LAMICTAL) 100 MG tablet Take 1 tablet (100 mg total) by mouth daily.  . potassium citrate (UROCIT-K) 10 MEQ (1080 MG) SR tablet take 3 tablets by mouth every morning and 2 tablets by mouth at bedtime  . sildenafil (REVATIO) 20 MG tablet Take 1 tablet (20 mg total) by mouth at bedtime as needed.  . sildenafil (VIAGRA) 100 MG tablet Take 1 tablet (100 mg total) by mouth daily as needed.  . simvastatin (ZOCOR) 10 MG tablet take 1 tablet by mouth at bedtime  . SUMAtriptan (IMITREX) 20 MG/ACT nasal  spray Place 1 spray (20 mg total) into the nose every 2 (two) hours as needed.  . topiramate (TOPAMAX) 100 MG tablet Take 1 tablet (100 mg total) by mouth daily.   Past Medical History:  Diagnosis Date  . ADHD (attention deficit hyperactivity disorder)   . Allergy   . Asthma   . Chronic kidney disease   . ED (erectile dysfunction)   . Hyperlipidemia   . Migraine    History reviewed. No pertinent surgical history. Social History   Social History Narrative   He works for [employer].  Previous Environmental consultanthazmat worker.   He is married.  No tobacco alcohol or drug use.   He drinks 64 ounces of coffee a day      10/25/2017      family history is not on file. He was adopted.   Review of Systems As above  Objective:   Physical Exam BP 126/80   Pulse 76   Ht 5\' 8"  (1.727 m)   Wt 160 lb (72.6 kg)   BMI 24.33 kg/m  No acute distress  20 minutes time spent with the patient reviewing things over half of which was in counseling and coordination of care

## 2017-10-27 ENCOUNTER — Encounter: Payer: Self-pay | Admitting: Physical Therapy

## 2017-10-28 ENCOUNTER — Encounter: Payer: Self-pay | Admitting: Internal Medicine

## 2017-11-02 ENCOUNTER — Encounter: Payer: No Typology Code available for payment source | Admitting: Internal Medicine

## 2017-11-09 ENCOUNTER — Encounter: Payer: Self-pay | Admitting: Internal Medicine

## 2017-11-09 ENCOUNTER — Ambulatory Visit (AMBULATORY_SURGERY_CENTER): Payer: No Typology Code available for payment source | Admitting: Internal Medicine

## 2017-11-09 ENCOUNTER — Other Ambulatory Visit: Payer: Self-pay

## 2017-11-09 VITALS — BP 106/72 | HR 50 | Temp 97.5°F | Resp 12 | Ht 68.0 in | Wt 160.0 lb

## 2017-11-09 DIAGNOSIS — Z1212 Encounter for screening for malignant neoplasm of rectum: Secondary | ICD-10-CM

## 2017-11-09 DIAGNOSIS — Z1211 Encounter for screening for malignant neoplasm of colon: Secondary | ICD-10-CM | POA: Diagnosis not present

## 2017-11-09 MED ORDER — SODIUM CHLORIDE 0.9 % IV SOLN: 500.0000 mL | Freq: Once | INTRAVENOUS | Status: DC

## 2017-11-09 NOTE — Progress Notes (Signed)
Pt's states no medical or surgical changes since previsit or office visit. 

## 2017-11-09 NOTE — Progress Notes (Signed)
A/ox3 pleased with MAC, report to [facility]

## 2017-11-09 NOTE — Op Note (Signed)
[name] Patient Name: Jimmy Holmes Procedure Date: 11/09/2017 8:02 AM MRN: [id number] Endoscopist: Iva Boop , MD Age: 56 Referring MD:  Date of Birth: 06-Jun-1961 Gender: Male Account #: [id number] Procedure:                Colonoscopy Indications:              Screening for colorectal malignant neoplasm, This                            is the patient's first colonoscopy Medicines:                Propofol per Anesthesia, Monitored Anesthesia Care Procedure:                Pre-Anesthesia Assessment:                           - Prior to the procedure, a History and Physical                            was performed, and patient medications and                            allergies were reviewed. The patient's tolerance of                            previous anesthesia was also reviewed. The risks                            and benefits of the procedure and the sedation                            options and risks were discussed with the patient.                            All questions were answered, and informed consent                            was obtained. Prior Anticoagulants: The patient has                            taken no previous anticoagulant or antiplatelet                            agents. ASA Grade Assessment: II - A patient with                            mild systemic disease. After reviewing the risks                            and benefits, the patient was deemed in                            satisfactory condition to undergo the procedure.  After obtaining informed consent, the colonoscope                            was passed under direct vision. Throughout the                            procedure, the patient's blood pressure, pulse, and                            oxygen saturations were monitored continuously. The                            Colonoscope was introduced through the anus and   advanced to the the cecum, identified by                            appendiceal orifice and ileocecal valve. The                            quality of the bowel preparation was excellent. The                            colonoscopy was performed without difficulty. The                            patient tolerated the procedure well. The bowel                            preparation used was Miralax. The ileocecal valve,                            appendiceal orifice, and rectum were photographed. Scope In: 8:20:35 AM Scope Out: 8:35:21 AM Scope Withdrawal Time: 0 hours 11 minutes 32 seconds  Total Procedure Duration: 0 hours 14 minutes 46 seconds  Findings:                 The perianal and digital rectal examinations were                            normal. Pertinent negatives include normal prostate                            (size, shape, and consistency).                           The colon (entire examined portion) appeared normal.                           No additional abnormalities were found on                            retroflexion. Complications:            No immediate complications. Estimated blood loss:  None. Estimated Blood Loss:     Estimated blood loss: none. Recommendation:           - Repeat colonoscopy/other test in 10 years for                            screening purposes.                           - Patient has a contact number available for                            emergencies. The signs and symptoms of potential                            delayed complications were discussed with the                            patient. Return to normal activities tomorrow.                            Written discharge instructions were provided to the                            patient.                           - Resume previous diet.                           - Continue present medications. Iva Booparl E Shyler Hamill, MD 11/09/2017 8:41:51 AM This report has  been signed electronically.

## 2017-11-09 NOTE — Patient Instructions (Addendum)
   Normal colonoscopy!  Next routine colonoscopy or other screening test in 10 years - 2028  I appreciate the opportunity to care for you. Iva Booparl E. Alin Chavira, MD, FACG   YOU HAD AN ENDOSCOPIC PROCEDURE TODAY: Refer to the procedure report and other information in the discharge instructions given to you for any specific questions about what was found during the examination. If this information does not answer your questions, please call [place] office at [phone] to clarify.   YOU SHOULD EXPECT: Some feelings of bloating in the abdomen. Passage of more gas than usual. Walking can help get rid of the air that was put into your GI tract during the procedure and reduce the bloating. If you had a lower endoscopy (such as a colonoscopy or flexible sigmoidoscopy) you may notice spotting of blood in your stool or on the toilet paper. Some abdominal soreness may be present for a day or two, also.  DIET: Your first meal following the procedure should be a light meal and then it is ok to progress to your normal diet. A half-sandwich or bowl of soup is an example of a good first meal. Heavy or fried foods are harder to digest and may make you feel nauseous or bloated. Drink plenty of fluids but you should avoid alcoholic beverages for 24 hours. If you had a esophageal dilation, please see attached instructions for diet.    ACTIVITY: Your care partner should take you home directly after the procedure. You should plan to take it easy, moving slowly for the rest of the day. You can resume normal activity the day after the procedure however YOU SHOULD NOT DRIVE, use power tools, machinery or perform tasks that involve climbing or major physical exertion for 24 hours (because of the sedation medicines used during the test).   SYMPTOMS TO REPORT IMMEDIATELY: A gastroenterologist can be reached at any hour. Please call [phone]  for any of the following symptoms:  Following lower endoscopy (colonoscopy,  flexible sigmoidoscopy) Excessive amounts of blood in the stool  Significant tenderness, worsening of abdominal pains  Swelling of the abdomen that is new, acute  Fever of 100 or higher   FOLLOW UP:  If any biopsies were taken you will be contacted by phone or by letter within the next 1-3 weeks. Call [phone]  if you have not heard about the biopsies in 3 weeks.  Please also call with any specific questions about appointments or follow up tests.

## 2017-11-10 ENCOUNTER — Telehealth: Payer: Self-pay

## 2017-11-10 NOTE — Telephone Encounter (Signed)
  Follow up Call-  Call back number 11/09/2017  Post procedure Call Back phone  # [phone]  Permission to leave phone message Yes  Some recent data might be hidden     Patient questions:  Do you have a fever, pain , or abdominal swelling? No. Pain Score  0 *  Have you tolerated food without any problems? Yes.    Have you been able to return to your normal activities? Yes.    Do you have any questions about your discharge instructions: Diet   No. Medications  No. Follow up visit  No.  Do you have questions or concerns about your Care? No.  Actions: * If pain score is 4 or above: No action needed, pain <4.

## 2018-02-01 ENCOUNTER — Encounter: Payer: Self-pay | Admitting: Family Medicine

## 2018-02-02 ENCOUNTER — Other Ambulatory Visit: Payer: Self-pay | Admitting: Family Medicine

## 2018-02-02 MED ORDER — SILDENAFIL CITRATE 20 MG PO TABS: 20.0000 mg | ORAL_TABLET | Freq: Every evening | ORAL | 11 refills | Status: DC | PRN

## 2018-03-26 IMAGING — CR DG HAND COMPLETE 3+V*R*
3 series · 3 of 3 positions shown · non-contrast
Comparison: None.

CLINICAL DATA: Recent fall yesterday with persistent hand pain,
initial encounter

EXAM:
RIGHT HAND - COMPLETE 3+ VIEW

[view not recorded (1 of 3)]
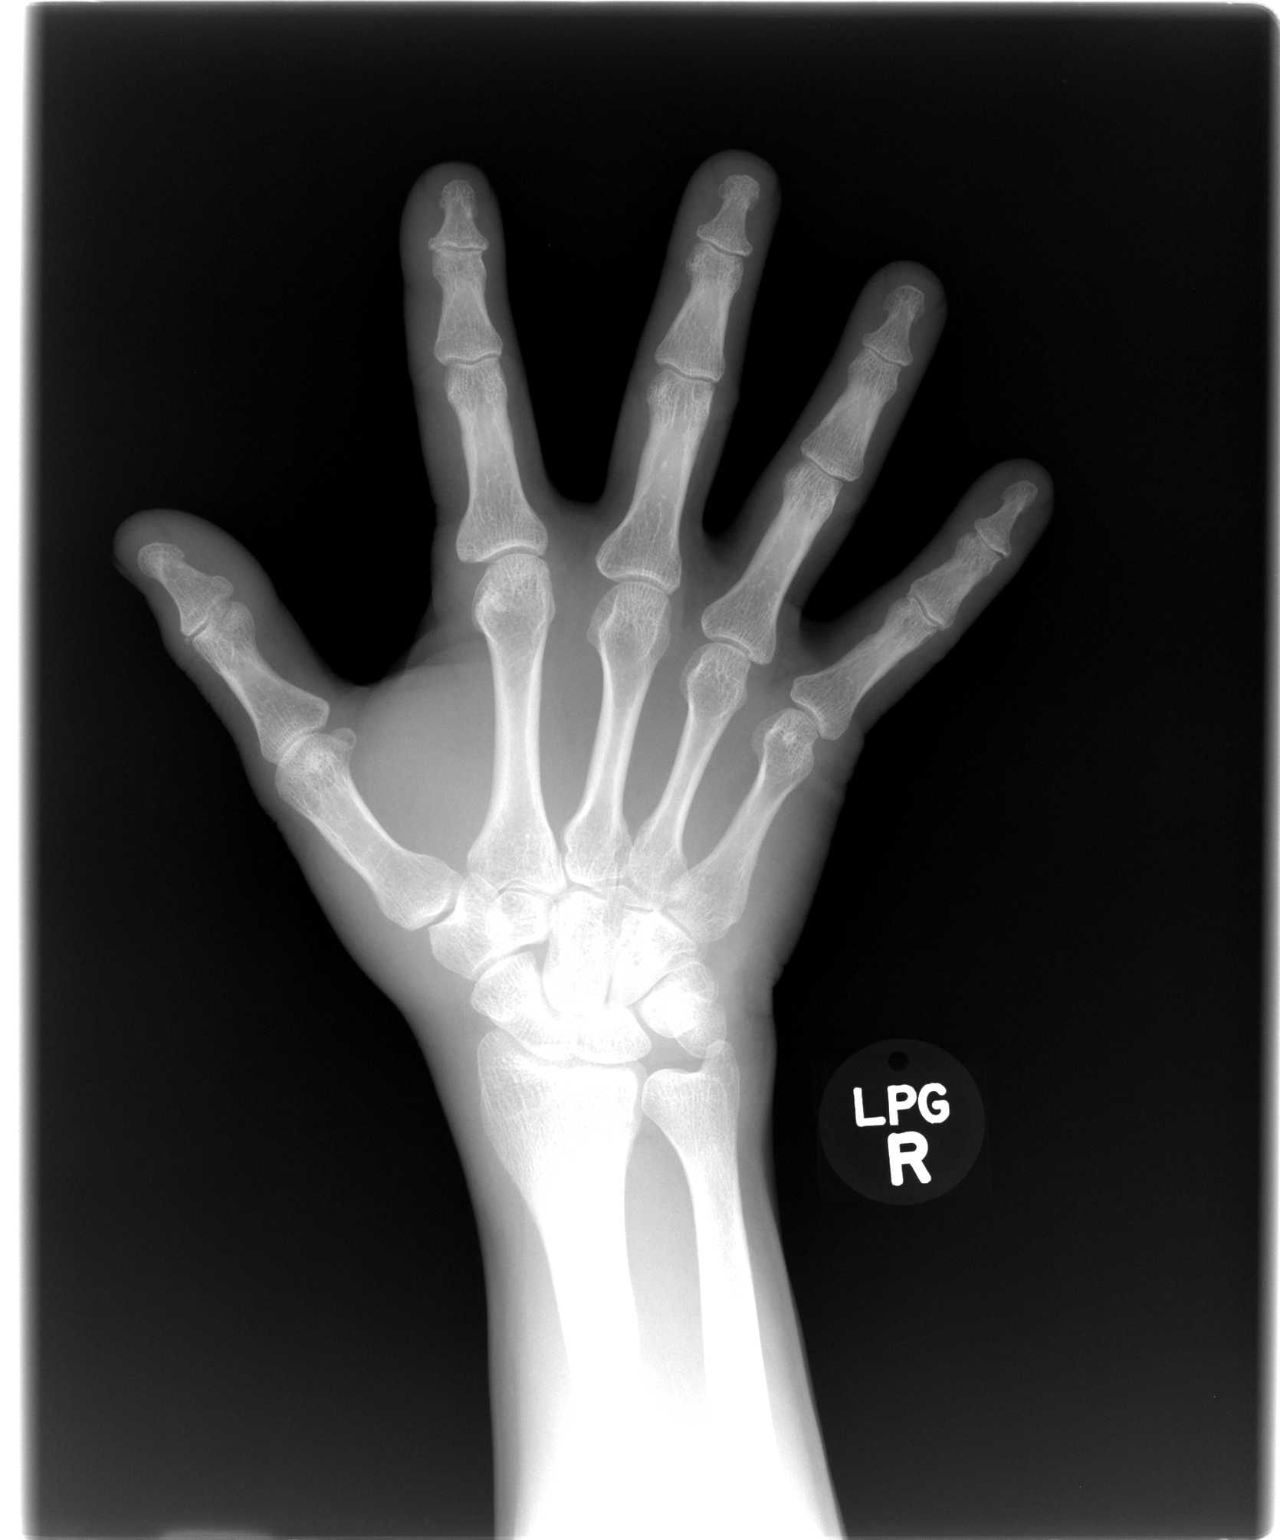

[view not recorded (2 of 3)]
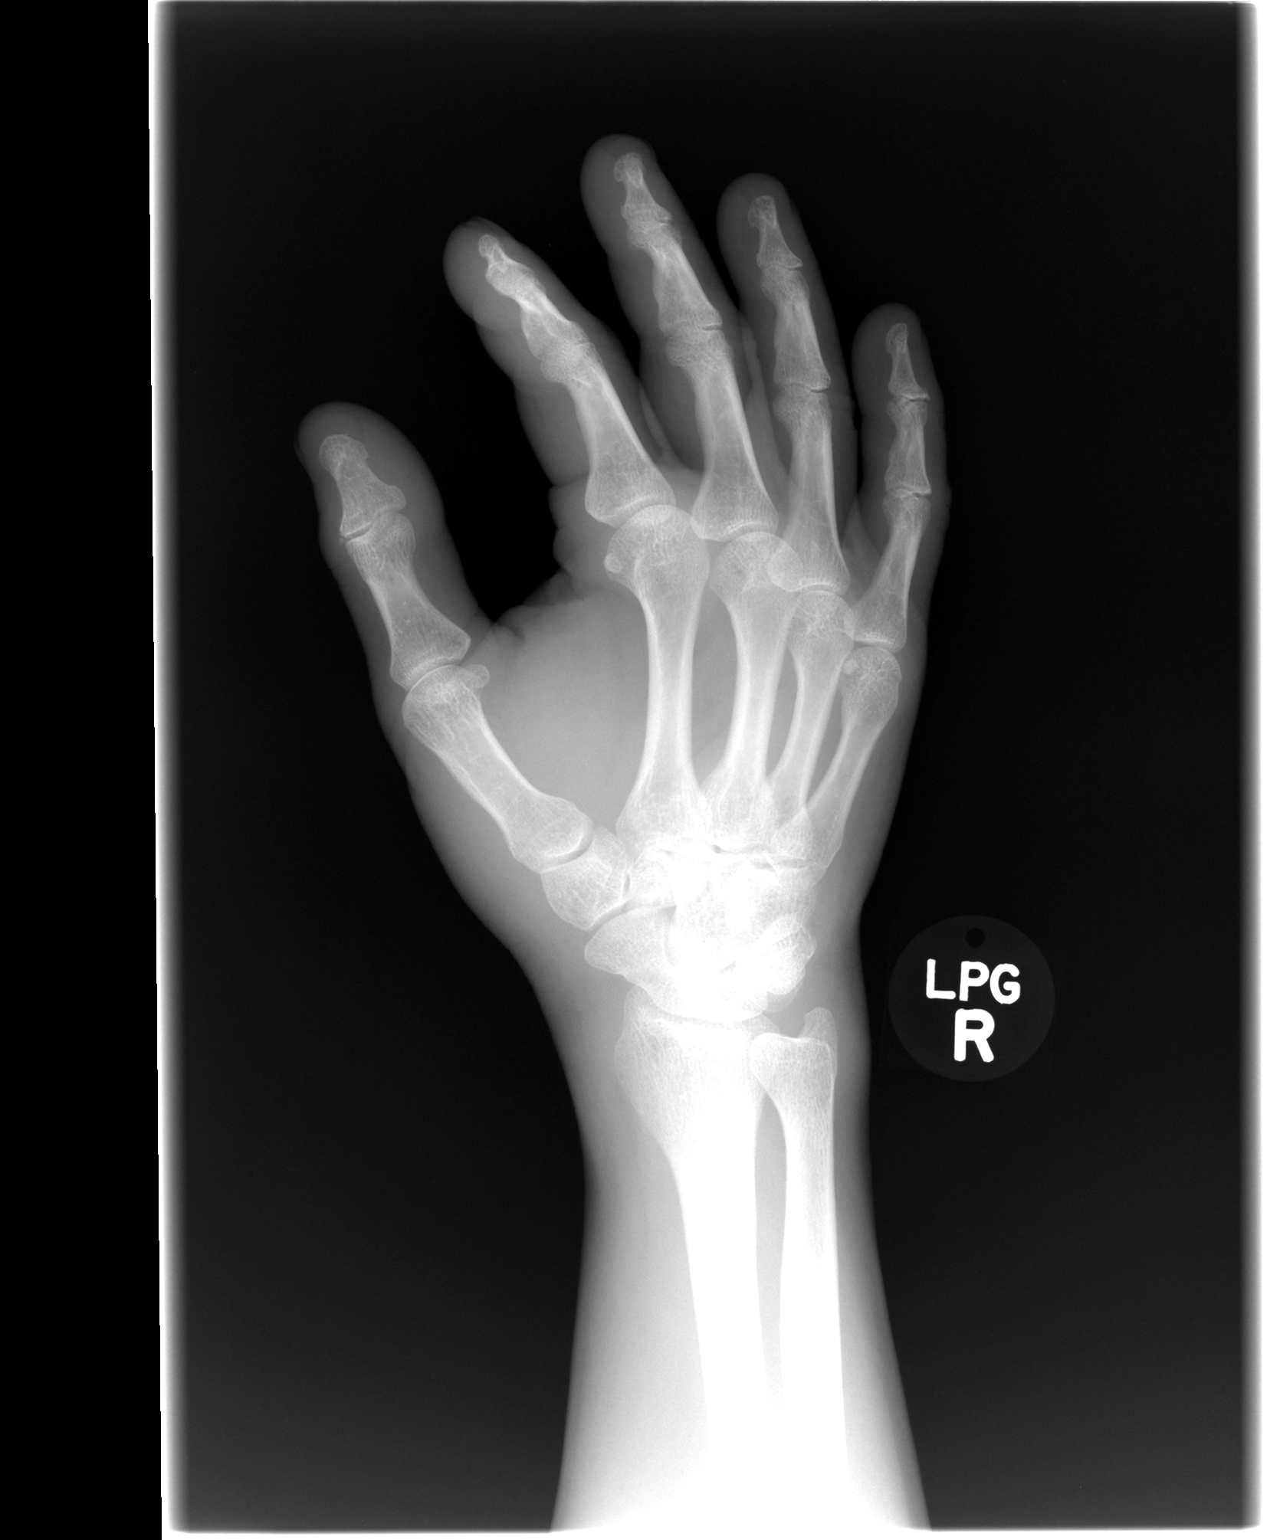

[view not recorded (3 of 3)]
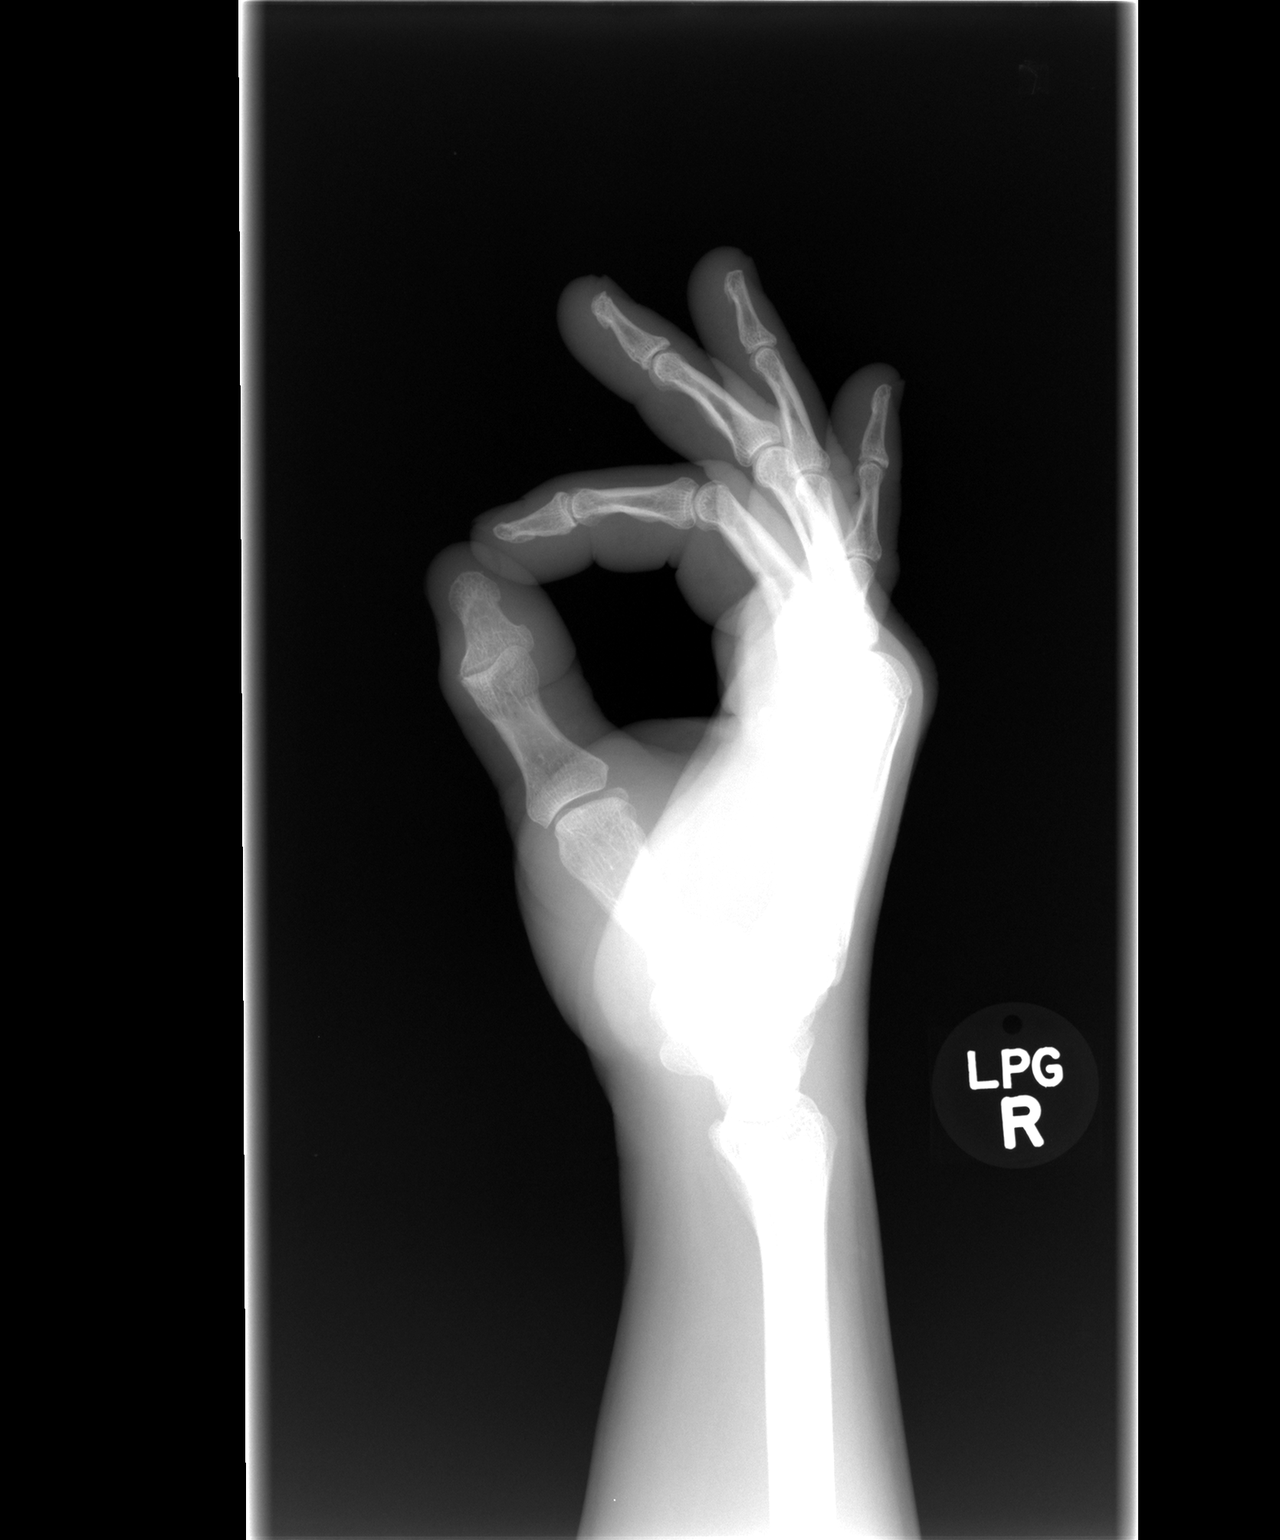

[3 of 3 positions shown; findings below may reference images not displayed]

FINDINGS: No acute fracture or dislocation is noted. Tiny well corticated
density is noted adjacent to the interphalangeal joint of the thumb
consistent with prior trauma. No soft tissue abnormality is noted.
IMPRESSION: No acute abnormality seen.

## 2018-08-29 ENCOUNTER — Ambulatory Visit: Payer: No Typology Code available for payment source | Admitting: Family Medicine

## 2018-08-29 ENCOUNTER — Encounter: Payer: Self-pay | Admitting: Family Medicine

## 2018-08-29 VITALS — BP 100/78 | HR 66 | Temp 98.3°F | Ht 68.0 in | Wt 167.5 lb

## 2018-08-29 DIAGNOSIS — J189 Pneumonia, unspecified organism: Secondary | ICD-10-CM

## 2018-08-29 DIAGNOSIS — J181 Lobar pneumonia, unspecified organism: Secondary | ICD-10-CM | POA: Diagnosis not present

## 2018-08-29 MED ORDER — DOXYCYCLINE HYCLATE 100 MG PO TABS: 100.0000 mg | ORAL_TABLET | Freq: Two times a day (BID) | ORAL | 0 refills | Status: DC

## 2018-08-29 MED ORDER — HYDROCODONE-HOMATROPINE 5-1.5 MG/5ML PO SYRP: ORAL_SOLUTION | ORAL | 0 refills | Status: DC

## 2018-08-29 NOTE — Patient Instructions (Signed)
Drink lots of water  Tylenol for fever and chills  Doxycycline 100 mg twice daily until bottle empty  Hydromet 1/2 to 1 teaspoon at bedtime.  For nighttime cough  Return as needed  Make an appointment to see Jimmy Holmes to get established and have your next physical exam

## 2018-08-29 NOTE — Progress Notes (Signed)
Jimmy Holmes is a 57 year old married male non-smoker who comes in today for evaluation of a cough for 3 weeks  He and his wife had the grandchildren who were sick around 303-1/2 weeks ago.  He developed their cold.  However the cough has not gone away and yesterday developed some fever and chills.  He is bringing up a lot of yellow sputum.  He is never had trouble with his lungs in the past.  He is a non-smoker  BP 100/78 (BP Location: Left Arm, Patient Position: Sitting, Cuff Size: Large)   Pulse 66   Temp 98.3 F (36.8 C) (Oral)   Ht 5\' 8"  (1.727 m)   Wt 167 lb 8 oz (76 kg)   SpO2 97%   BMI 25.47 kg/m  Well-developed well-nourished male no acute distress vital signs stable he is afebrile HEENT were negative lungs are clear except for crackles left base  1.  Left lower lobe pneumonia.......... treat with doxycycline and Hydromet

## 2018-09-14 ENCOUNTER — Other Ambulatory Visit: Payer: Self-pay | Admitting: Family Medicine

## 2018-09-14 DIAGNOSIS — Z87442 Personal history of urinary calculi: Secondary | ICD-10-CM

## 2018-09-14 DIAGNOSIS — E7841 Elevated Lipoprotein(a): Secondary | ICD-10-CM

## 2018-11-15 ENCOUNTER — Encounter: Payer: Self-pay | Admitting: Adult Health

## 2018-11-15 ENCOUNTER — Ambulatory Visit: Payer: No Typology Code available for payment source | Admitting: Adult Health

## 2018-11-15 ENCOUNTER — Other Ambulatory Visit: Payer: Self-pay | Admitting: Adult Health

## 2018-11-15 VITALS — BP 136/86 | Temp 98.3°F | Wt 172.0 lb

## 2018-11-15 DIAGNOSIS — F3178 Bipolar disorder, in full remission, most recent episode mixed: Secondary | ICD-10-CM | POA: Diagnosis not present

## 2018-11-15 DIAGNOSIS — Z125 Encounter for screening for malignant neoplasm of prostate: Secondary | ICD-10-CM

## 2018-11-15 DIAGNOSIS — I10 Essential (primary) hypertension: Secondary | ICD-10-CM

## 2018-11-15 DIAGNOSIS — Z0000 Encounter for general adult medical examination without abnormal findings: Secondary | ICD-10-CM

## 2018-11-15 DIAGNOSIS — E782 Mixed hyperlipidemia: Secondary | ICD-10-CM | POA: Diagnosis not present

## 2018-11-15 DIAGNOSIS — Z87442 Personal history of urinary calculi: Secondary | ICD-10-CM

## 2018-11-15 DIAGNOSIS — E7841 Elevated Lipoprotein(a): Secondary | ICD-10-CM

## 2018-11-15 LAB — CBC WITH DIFFERENTIAL/PLATELET
BASOS ABS: 0 10*3/uL (ref 0.0–0.1)
Basophils Relative: 0.7 % (ref 0.0–3.0)
Eosinophils Absolute: 0.3 10*3/uL (ref 0.0–0.7)
Eosinophils Relative: 5.3 % — ABNORMAL HIGH (ref 0.0–5.0)
HCT: 49.7 % (ref 39.0–52.0)
Hemoglobin: 17.1 g/dL — ABNORMAL HIGH (ref 13.0–17.0)
Lymphocytes Relative: 20.5 % (ref 12.0–46.0)
Lymphs Abs: 1.3 10*3/uL (ref 0.7–4.0)
MCHC: 34.5 g/dL (ref 30.0–36.0)
MCV: 92.3 fl (ref 78.0–100.0)
Monocytes Absolute: 0.5 10*3/uL (ref 0.1–1.0)
Monocytes Relative: 7.5 % (ref 3.0–12.0)
Neutro Abs: 4.2 10*3/uL (ref 1.4–7.7)
Neutrophils Relative %: 66 % (ref 43.0–77.0)
Platelets: 179 10*3/uL (ref 150.0–400.0)
RBC: 5.38 Mil/uL (ref 4.22–5.81)
RDW: 13.6 % (ref 11.5–15.5)
WBC: 6.3 10*3/uL (ref 4.0–10.5)

## 2018-11-15 LAB — HEPATIC FUNCTION PANEL
ALK PHOS: 57 U/L (ref 39–117)
ALT: 26 U/L (ref 0–53)
AST: 19 U/L (ref 0–37)
Albumin: 4.5 g/dL (ref 3.5–5.2)
Bilirubin, Direct: 0.1 mg/dL (ref 0.0–0.3)
Total Bilirubin: 0.3 mg/dL (ref 0.2–1.2)
Total Protein: 6.7 g/dL (ref 6.0–8.3)

## 2018-11-15 LAB — BASIC METABOLIC PANEL
BUN: 17 mg/dL (ref 6–23)
CALCIUM: 9.7 mg/dL (ref 8.4–10.5)
CO2: 31 mEq/L (ref 19–32)
Chloride: 102 mEq/L (ref 96–112)
Creatinine, Ser: 0.99 mg/dL (ref 0.40–1.50)
GFR: 83.17 mL/min (ref >60.00)
Glucose, Bld: 109 mg/dL — ABNORMAL HIGH (ref 70–99)
Potassium: 3.9 mEq/L (ref 3.5–5.1)
Sodium: 140 mEq/L (ref 135–145)

## 2018-11-15 LAB — LIPID PANEL
Cholesterol: 179 mg/dL (ref 0–200)
HDL: 53.8 mg/dL (ref >39.00)
LDL Cholesterol: 111 mg/dL — ABNORMAL HIGH (ref 0–99)
NonHDL: 124.7
Total CHOL/HDL Ratio: 3
Triglycerides: 67 mg/dL (ref 0.0–149.0)
VLDL: 13.4 mg/dL (ref 0.0–40.0)

## 2018-11-15 LAB — PSA: PSA: 0.69 ng/mL (ref 0.10–4.00)

## 2018-11-15 LAB — TSH: TSH: 1.8 u[IU]/mL (ref 0.35–4.50)

## 2018-11-15 MED ORDER — CETIRIZINE HCL 10 MG PO TABS: 10.0000 mg | ORAL_TABLET | Freq: Every day | ORAL | 3 refills | Status: DC

## 2018-11-15 MED ORDER — POTASSIUM CITRATE ER 10 MEQ (1080 MG) PO TBCR: EXTENDED_RELEASE_TABLET | ORAL | 3 refills | Status: DC

## 2018-11-15 MED ORDER — CHLORTHALIDONE 25 MG PO TABS: 25.0000 mg | ORAL_TABLET | Freq: Every day | ORAL | 3 refills | Status: DC

## 2018-11-15 MED ORDER — SILDENAFIL CITRATE 20 MG PO TABS: 20.0000 mg | ORAL_TABLET | Freq: Every evening | ORAL | 11 refills | Status: DC | PRN

## 2018-11-15 MED ORDER — SIMVASTATIN 10 MG PO TABS: 10.0000 mg | ORAL_TABLET | Freq: Every day | ORAL | 3 refills | Status: DC

## 2018-11-15 MED ORDER — LAMOTRIGINE 100 MG PO TABS: ORAL_TABLET | ORAL | 3 refills | Status: DC

## 2018-11-15 MED ORDER — TOPIRAMATE 100 MG PO TABS: ORAL_TABLET | ORAL | 3 refills | Status: DC

## 2018-11-15 NOTE — Telephone Encounter (Signed)
Copied from CRM [phone]. Topic: Quick Communication - Rx Refill/Question >> Nov 15, 2018  8:42 AM Jaquita Rectoravis, Karen A wrote: Medication: cetirizine (ZYRTEC) 10 MG tablet, chlorthalidone (HYGROTON) 25 MG tablet, lamoTRIgine (LAMICTAL) 100 MG tablet, potassium citrate (UROCIT-K) 10 MEQ (1080 MG) SR tablet, sildenafil (REVATIO) 20 MG tablet, simvastatin (ZOCOR) 10 MG tablet , topiramate (TOPAMAX) 100 MG tablet  Patient requesting updated Rx's for the new year  Has the patient contacted their pharmacy? Yes.   (Agent: If no, request that the patient contact the pharmacy for the refill.) (Agent: If yes, when and what did the pharmacy advise?)  Preferred Pharmacy (with phone number or street name): [id number] - [place], [place] - [address] [facility] [address] [phone] (Phone) [phone] (Fax)    Agent: Please be advised that RX refills may take up to 3 business days. We ask that you follow-up with your pharmacy.

## 2018-11-15 NOTE — Patient Instructions (Signed)
It was great meeting you today   We will follow up with you regarding your blood work   Please let me know if you need anything  

## 2018-11-15 NOTE — Telephone Encounter (Signed)
Requested medication (s) are due for refill today -yes  Requested medication (s) are on the active medication list -yes  Future visit scheduled -yes  Last refill: most requested medications were filled- 09/14/18, the sildenafil was filled 02/02/18, and the cetirizine is historical  Notes to clinic: Patient was former patient of Dr Tawanna Cooler- has appointment upcoming with C Nafziger. Sent for review for refills requested.  Requested Prescriptions  Pending Prescriptions Disp Refills   topiramate (TOPAMAX) 100 MG tablet 100 tablet 0     Not Delegated - Neurology: Anticonvulsants - topiramate & zonisamide Failed - 11/15/2018  9:07 AM      Failed - This refill cannot be delegated      Failed - Cr in normal range and within 360 days    Creatinine, Ser  Date Value Ref Range Status  08/23/2017 0.91 0.40 - 1.50 mg/dL Final         Failed - C02 in normal range and within 360 days    CO2  Date Value Ref Range Status  08/23/2017 31 19 - 32 mEq/L Final         Passed - Valid encounter within last 12 months    Recent Outpatient Visits          Today Routine general medical examination at a health care facility   [facility] [place], [place], NP   2 months ago Pneumonia of left lower lobe due to infectious organism (HCC)   [facility], Eugenio Hoes, MD   1 year ago Hyperlipidemia, unspecified hyperlipidemia type   Nature conservation officer at [facility], Eugenio Hoes, MD   1 year ago Flu-like symptoms   [facility], Dwana Curd, MD   1 year ago Acute bronchitis, unspecified organism   Nature conservation officer at [facility], Tera Mater, MD      Future Appointments            In 1 year Nafziger, Kandee Keen, NP [facility] [place], [facility]          potassium citrate (UROCIT-K) 10 MEQ (1080 MG) SR tablet 450 tablet 0    Sig: TAKE 3 TABLETS BY MOUTH EVERY MORNING AND 2 TABLETS AT BEDTIME     Endocrinology:  Minerals -  Potassium Citrate Failed - 11/15/2018  9:07 AM      Failed - C02 in normal range and within 120 days    CO2  Date Value Ref Range Status  08/23/2017 31 19 - 32 mEq/L Final         Failed - Cl in normal range and within 120 days    Chloride  Date Value Ref Range Status  08/23/2017 100 96 - 112 mEq/L Final         Failed - Cr in normal range and within 120 days    Creatinine, Ser  Date Value Ref Range Status  08/23/2017 0.91 0.40 - 1.50 mg/dL Final         Failed - K in normal range and within 120 days    Potassium  Date Value Ref Range Status  08/23/2017 3.6 3.5 - 5.1 mEq/L Final         Failed - Na in normal range and within 120 days    Sodium  Date Value Ref Range Status  08/23/2017 138 135 - 145 mEq/L Final         Failed - WBC in normal range and within 120 days    WBC  Date Value Ref Range Status  08/23/2017 5.3 4.0 - 10.5 K/uL Final         Failed - RBC in normal range and within 120 days    RBC  Date Value Ref Range Status  08/23/2017 5.25 4.22 - 5.81 Mil/uL Final         Failed - HGB in normal range and within 120 days    Hemoglobin  Date Value Ref Range Status  08/23/2017 16.9 13.0 - 17.0 g/dL Final         Failed - HCT in normal range and within 120 days    HCT  Date Value Ref Range Status  08/23/2017 48.4 39.0 - 52.0 % Final         Failed - PLT in normal range and within 120 days    Platelets  Date Value Ref Range Status  08/23/2017 175.0 150.0 - 400.0 K/uL Final         Passed - Valid encounter within last 12 months    Recent Outpatient Visits          Today Routine general medical examination at a health care facility   [facility] at [place], [place], NP   2 months ago Pneumonia of left lower lobe due to infectious organism [facility])   Adult nurse [facility], Eugenio Hoes, MD   1 year ago Hyperlipidemia, unspecified hyperlipidemia type   Nature conservation officer at [employer] [facility], Eugenio Hoes, MD   1 year ago  Flu-like symptoms   [facility], Dwana Curd, MD   1 year ago Acute bronchitis, unspecified organism   Nature conservation officer at [employer], Tera Mater, MD      Future Appointments            In 1 year Nafziger, Kandee Keen, NP [facility] [place], [facility]          sildenafil (REVATIO) 20 MG tablet 20 tablet 11    Sig: Take 1 tablet (20 mg total) by mouth at bedtime as needed.     Urology: Erectile Dysfunction Agents Passed - 11/15/2018  9:07 AM      Passed - Last BP in normal range    BP Readings from Last 1 Encounters:  11/15/18 136/86         Passed - Valid encounter within last 12 months    Recent Outpatient Visits          Today Routine general medical examination at a health care facility   [facility] at [place], [place], NP   2 months ago Pneumonia of left lower lobe due to infectious organism [facility])   Adult nurse [facility], Eugenio Hoes, MD   1 year ago Hyperlipidemia, unspecified hyperlipidemia type   Nature conservation officer at [employer] [facility], Eugenio Hoes, MD   1 year ago Flu-like symptoms   [facility], Dwana Curd, MD   1 year ago Acute bronchitis, unspecified organism   Nature conservation officer at [employer], Tera Mater, MD      Future Appointments            In 1 year Nafziger, Kandee Keen, NP [facility] [place], [facility]          simvastatin (ZOCOR) 10 MG tablet 100 tablet 0    Sig: Take 1 tablet (10 mg total) by mouth at bedtime.     Cardiovascular:  Antilipid - Statins Failed - 11/15/2018  9:07 AM  Failed - Total Cholesterol in normal range and within 360 days    Cholesterol  Date Value Ref Range Status  08/23/2017 158 0 - 200 mg/dL Final    Comment:    ATP III Classification       Desirable:  < 200 mg/dL               Borderline High:  200 - 239 mg/dL          High:  > = 952 mg/dL         Failed - LDL in normal range and within 360 days    LDL Cholesterol   Date Value Ref Range Status  08/23/2017 100 (H) 0 - 99 mg/dL Final         Failed - HDL in normal range and within 360 days    HDL  Date Value Ref Range Status  08/23/2017 51.70 >39.00 mg/dL Final         Failed - Triglycerides in normal range and within 360 days    Triglycerides  Date Value Ref Range Status  08/23/2017 35.0 0.0 - 149.0 mg/dL Final    Comment:    Normal:  <150 mg/dLBorderline High:  150 - 199 mg/dL   Triglyceride fasting, serum  Date Value Ref Range Status  09/22/2006 46 0 - 149 mg/dL Final    Comment:    See lab report for associated comment(s)         Passed - Patient is not pregnant      Passed - Valid encounter within last 12 months    Recent Outpatient Visits          Today Routine general medical examination at a health care facility   [facility] at [place], [place], NP   2 months ago Pneumonia of left lower lobe due to infectious organism (HCC)   Adult nurse [facility], Eugenio Hoes, MD   1 year ago Hyperlipidemia, unspecified hyperlipidemia type   Nature conservation officer at [employer], Eugenio Hoes, MD   1 year ago Flu-like symptoms   [facility], Dwana Curd, MD   1 year ago Acute bronchitis, unspecified organism   Nature conservation officer at [employer], Tera Mater, MD      Future Appointments            In 1 year Nafziger, Kandee Keen, NP [facility] [place], [facility]          cetirizine (ZYRTEC) 10 MG tablet      Sig: Take 1 tablet (10 mg total) by mouth daily.     Ear, Nose, and Throat:  Antihistamines Passed - 11/15/2018  9:07 AM      Passed - Valid encounter within last 12 months    Recent Outpatient Visits          Today Routine general medical examination at a health care facility   [facility] [place], [place], NP   2 months ago Pneumonia of left lower lobe due to infectious organism [facility])   Adult nurse [facility], Eugenio Hoes, MD   1  year ago Hyperlipidemia, unspecified hyperlipidemia type   Nature conservation officer at [employer] [facility], Eugenio Hoes, MD   1 year ago Flu-like symptoms   [facility], Dwana Curd, MD   1 year ago Acute bronchitis, unspecified organism   Nature conservation officer at [employer], Tera Mater, MD      Future Appointments  In 1 year Nafziger, K[facility]ee Keenory, [facility] [place]          chlorthalidone (HYGROTON) 25 MG tablet 100 tablet 0    Sig: Take 1 tablet (25 mg total) by mouth daily.     Cardiovascular: Diuretics - Thiazide Failed - 11/15/2018  9:07 AM      Failed - Ca in normal range and within 360 days    Calcium  Date Value Ref Range Status  08/23/2017 9.8 8.4 - 10.5 mg/dL Final         Failed - Cr in normal range and within 360 days    Creatinine, Ser  Date Value Ref Range Status  08/23/2017 0.91 0.40 - 1.50 mg/dL Final         Failed - K in normal range and within 360 days    Potassium  Date Value Ref Range Status  08/23/2017 3.6 3.5 - 5.1 mEq/L Final         Failed - Na in normal range and within 360 days    Sodium  Date Value Ref Range Status  08/23/2017 138 135 - 145 mEq/L Final         Passed - Last BP in normal range    BP Readings from Last 1 Encounters:  11/15/18 136/86         Passed - Valid encounter within last 6 months    Recent Outpatient Visits          Today Routine general medical examination at a health care facility   [facility] [place] [facility], [place]   2 months ago Pneumonia of left lower lobe due to infectious organism (HCC)   [facility], Eugenio HoesJeffrey A, MD   1 year ago Hyperlipidemia, unspecified hyperlipidemia type   [facility], Eugenio HoesJeffrey A, MD   1 year ago Flu-like symptoms   [facility] Duncan, Dwana CurdGraham S, MD   1 year ago Acute bronchitis, unspecified organism   [facility],  Tera MaterStephen A, MD      Future Appointments            In 1 year Nafziger, Kandee Keenory, NP [facility] [place]          lamoTRIgine (LAMICTAL) 100 MG tablet 100 tablet 0     Not Delegated - Neurology:  Anticonvulsants Failed - 11/15/2018  9:07 AM      Failed - This refill cannot be delegated      Failed - HCT in normal range and within 360 days    HCT  Date Value Ref Range Status  08/23/2017 48.4 39.0 - 52.0 % Final         Failed - HGB in normal range and within 360 days    Hemoglobin  Date Value Ref Range Status  08/23/2017 16.9 13.0 - 17.0 g/dL Final         Failed - PLT in normal range and within 360 days    Platelets  Date Value Ref Range Status  08/23/2017 175.0 150.0 - 400.0 K/uL Final         Failed - WBC in normal range and within 360 days    WBC  Date Value Ref Range Status  08/23/2017 5.3 4.0 - 10.5 K/uL Final         Passed - Valid encounter within last 12 months    Recent Outpatient Visits  Today Routine general medical examination at a health care facility   [facility] [place], [place], NP   2 months ago Pneumonia of left lower lobe due to infectious organism [facility])   Adult nurse [facility], Eugenio Hoes, MD   1 year ago Hyperlipidemia, unspecified hyperlipidemia type   Nature conservation officer at [employer], Eugenio Hoes, MD   1 year ago Flu-like symptoms   [facility], Dwana Curd, MD   1 year ago Acute bronchitis, unspecified organism   Nature conservation officer at [employer], Tera Mater, MD      Future Appointments            In 1 year Nafziger, Kandee Keen, NP [facility] [place], [facility]            Requested Prescriptions  Pending Prescriptions Disp Refills   topiramate (TOPAMAX) 100 MG tablet 100 tablet 0     Not Delegated - Neurology: Anticonvulsants - topiramate & zonisamide Failed - 11/15/2018  9:07 AM      Failed - This refill cannot be delegated       Failed - Cr in normal range and within 360 days    Creatinine, Ser  Date Value Ref Range Status  08/23/2017 0.91 0.40 - 1.50 mg/dL Final         Failed - C02 in normal range and within 360 days    CO2  Date Value Ref Range Status  08/23/2017 31 19 - 32 mEq/L Final         Passed - Valid encounter within last 12 months    Recent Outpatient Visits          Today Routine general medical examination at a health care facility   [facility] [place], [place], NP   2 months ago Pneumonia of left lower lobe due to infectious organism (HCC)   Adult nurse [facility], Eugenio Hoes, MD   1 year ago Hyperlipidemia, unspecified hyperlipidemia type   Nature conservation officer at [employer], Eugenio Hoes, MD   1 year ago Flu-like symptoms   [facility], Dwana Curd, MD   1 year ago Acute bronchitis, unspecified organism   Nature conservation officer at [employer], Tera Mater, MD      Future Appointments            In 1 year Nafziger, Kandee Keen, NP [facility] [place]          potassium citrate (UROCIT-K) 10 MEQ (1080 MG) SR tablet 450 tablet 0    Sig: TAKE 3 TABLETS BY MOUTH EVERY MORNING AND 2 TABLETS AT BEDTIME     Endocrinology:  Minerals - Potassium Citrate Failed - 11/15/2018  9:07 AM      Failed - C02 in normal range and within 120 days    CO2  Date Value Ref Range Status  08/23/2017 31 19 - 32 mEq/L Final         Failed - Cl in normal range and within 120 days    Chloride  Date Value Ref Range Status  08/23/2017 100 96 - 112 mEq/L Final         Failed - Cr in normal range and within 120 days    Creatinine, Ser  Date Value Ref Range Status  08/23/2017 0.91 0.40 - 1.50 mg/dL Final         Failed - K in normal range and within 120 days    Potassium  Date Value Ref Range Status  08/23/2017 3.6 3.5 - 5.1 mEq/L Final         Failed - Na in normal range and within 120 days    Sodium  Date Value Ref Range  Status  08/23/2017 138 135 - 145 mEq/L Final         Failed - WBC in normal range and within 120 days    WBC  Date Value Ref Range Status  08/23/2017 5.3 4.0 - 10.5 K/uL Final         Failed - RBC in normal range and within 120 days    RBC  Date Value Ref Range Status  08/23/2017 5.25 4.22 - 5.81 Mil/uL Final         Failed - HGB in normal range and within 120 days    Hemoglobin  Date Value Ref Range Status  08/23/2017 16.9 13.0 - 17.0 g/dL Final         Failed - HCT in normal range and within 120 days    HCT  Date Value Ref Range Status  08/23/2017 48.4 39.0 - 52.0 % Final         Failed - PLT in normal range and within 120 days    Platelets  Date Value Ref Range Status  08/23/2017 175.0 150.0 - 400.0 K/uL Final         Passed - Valid encounter within last 12 months    Recent Outpatient Visits          Today Routine general medical examination at a health care facility   [facility] [place], [place], NP   2 months ago Pneumonia of left lower lobe due to infectious organism (HCC)   Adult nurse [facility], Eugenio Hoes, MD   1 year ago Hyperlipidemia, unspecified hyperlipidemia type   Nature conservation officer at [employer] [facility], Eugenio Hoes, MD   1 year ago Flu-like symptoms   [facility], Dwana Curd, MD   1 year ago Acute bronchitis, unspecified organism   Nature conservation officer at [employer], Tera Mater, MD      Future Appointments            In 1 year Nafziger, Kandee Keen, NP [facility] [place], [facility]          sildenafil (REVATIO) 20 MG tablet 20 tablet 11    Sig: Take 1 tablet (20 mg total) by mouth at bedtime as needed.     Urology: Erectile Dysfunction Agents Passed - 11/15/2018  9:07 AM      Passed - Last BP in normal range    BP Readings from Last 1 Encounters:  11/15/18 136/86         Passed - Valid encounter within last 12 months    Recent Outpatient Visits          Today Routine  general medical examination at a health care facility   [facility] [place], [place], NP   2 months ago Pneumonia of left lower lobe due to infectious organism [facility])   Adult nurse [facility], Eugenio Hoes, MD   1 year ago Hyperlipidemia, unspecified hyperlipidemia type   Nature conservation officer at [employer] [facility], Eugenio Hoes, MD   1 year ago Flu-like symptoms   [facility], Dwana Curd, MD   1 year ago Acute bronchitis, unspecified organism   Nature conservation officer at [employer], Tera Mater, MD      Future Appointments  In 1 year Nafziger, Kandee Keenory, NP [facility] [place]          simvastatin (ZOCOR) 10 MG tablet 100 tablet 0    Sig: Take 1 tablet (10 mg total) by mouth at bedtime.     Cardiovascular:  Antilipid - Statins Failed - 11/15/2018  9:07 AM      Failed - Total Cholesterol in normal range and within 360 days    Cholesterol  Date Value Ref Range Status  08/23/2017 158 0 - 200 mg/dL Final    Comment:    ATP III Classification       Desirable:  < 200 mg/dL               Borderline High:  200 - 239 mg/dL          High:  > = 638240 mg/dL         Failed - LDL in normal range and within 360 days    LDL Cholesterol  Date Value Ref Range Status  08/23/2017 100 (H) 0 - 99 mg/dL Final         Failed - HDL in normal range and within 360 days    HDL  Date Value Ref Range Status  08/23/2017 51.70 >39.00 mg/dL Final         Failed - Triglycerides in normal range and within 360 days    Triglycerides  Date Value Ref Range Status  08/23/2017 35.0 0.0 - 149.0 mg/dL Final    Comment:    Normal:  <150 mg/dLBorderline High:  150 - 199 mg/dL   Triglyceride fasting, serum  Date Value Ref Range Status  09/22/2006 46 0 - 149 mg/dL Final    Comment:    See lab report for associated comment(s)         Passed - Patient is not pregnant      Passed - Valid encounter within last 12 months    Recent  Outpatient Visits          Today Routine general medical examination at a health care facility   [facility] [place] [facility], [place], NP   2 months ago Pneumonia of left lower lobe due to infectious organism (HCC)   [facility], Eugenio HoesJeffrey A, MD   1 year ago Hyperlipidemia, unspecified hyperlipidemia type   [facility], Eugenio HoesJeffrey A, MD   1 year ago Flu-like symptoms   [facility] Duncan, Dwana CurdGraham S, MD   1 year ago Acute bronchitis, unspecified organism   [facility], Tera MaterStephen A, MD      Future Appointments            In 1 year Nafziger, Kandee Keenory, NP [facility] [place]          cetirizine (ZYRTEC) 10 MG tablet      Sig: Take 1 tablet (10 mg total) by mouth daily.     Ear, Nose, and Throat:  Antihistamines Passed - 11/15/2018  9:07 AM      Passed - Valid encounter within last 12 months    Recent Outpatient Visits          Today Routine general medical examination at a health care facility   [facility] [place] [facility], [place], NP   2 months ago Pneumonia of left lower lobe due to infectious organism [facility](HCC)   [facility], Eugenio HoesJeffrey A, MD   1 year ago Hyperlipidemia,  unspecified hyperlipidemia type   Nature conservation officer at [employer] [facility], Eugenio Hoes, MD   1 year ago Flu-like symptoms   [facility], Dwana Curd, MD   1 year ago Acute bronchitis, unspecified organism   Nature conservation officer at [employer], Tera Mater, MD      Future Appointments            In 1 year Nafziger, Kandee Keen, NP [facility] [place], [facility]          chlorthalidone (HYGROTON) 25 MG tablet 100 tablet 0    Sig: Take 1 tablet (25 mg total) by mouth daily.     Cardiovascular: Diuretics - Thiazide Failed - 11/15/2018  9:07 AM      Failed - Ca in normal range and within 360 days    Calcium  Date Value Ref Range  Status  08/23/2017 9.8 8.4 - 10.5 mg/dL Final         Failed - Cr in normal range and within 360 days    Creatinine, Ser  Date Value Ref Range Status  08/23/2017 0.91 0.40 - 1.50 mg/dL Final         Failed - K in normal range and within 360 days    Potassium  Date Value Ref Range Status  08/23/2017 3.6 3.5 - 5.1 mEq/L Final         Failed - Na in normal range and within 360 days    Sodium  Date Value Ref Range Status  08/23/2017 138 135 - 145 mEq/L Final         Passed - Last BP in normal range    BP Readings from Last 1 Encounters:  11/15/18 136/86         Passed - Valid encounter within last 6 months    Recent Outpatient Visits          Today Routine general medical examination at a health care facility   [facility] [place], [place], NP   2 months ago Pneumonia of left lower lobe due to infectious organism (HCC)   Adult nurse [facility], Eugenio Hoes, MD   1 year ago Hyperlipidemia, unspecified hyperlipidemia type   Nature conservation officer at [employer], Eugenio Hoes, MD   1 year ago Flu-like symptoms   [facility], Dwana Curd, MD   1 year ago Acute bronchitis, unspecified organism   Nature conservation officer at [employer], Tera Mater, MD      Future Appointments            In 1 year Nafziger, Kandee Keen, NP [facility] [place], [facility]          lamoTRIgine (LAMICTAL) 100 MG tablet 100 tablet 0     Not Delegated - Neurology:  Anticonvulsants Failed - 11/15/2018  9:07 AM      Failed - This refill cannot be delegated      Failed - HCT in normal range and within 360 days    HCT  Date Value Ref Range Status  08/23/2017 48.4 39.0 - 52.0 % Final         Failed - HGB in normal range and within 360 days    Hemoglobin  Date Value Ref Range Status  08/23/2017 16.9 13.0 - 17.0 g/dL Final         Failed - PLT in normal range and within 360 days    Platelets  Date Value Ref Range Status  08/23/2017  175.0 150.0 -  400.0 K/uL Final         Failed - WBC in normal range and within 360 days    WBC  Date Value Ref Range Status  08/23/2017 5.3 4.0 - 10.5 K/uL Final         Passed - Valid encounter within last 12 months    Recent Outpatient Visits          Today Routine general medical examination at a health care facility   [facility] [place], [place], NP   2 months ago Pneumonia of left lower lobe due to infectious organism [facility])   Adult nurse [facility], Eugenio Hoes, MD   1 year ago Hyperlipidemia, unspecified hyperlipidemia type   Nature conservation officer at [employer], Eugenio Hoes, MD   1 year ago Flu-like symptoms   [facility], Dwana Curd, MD   1 year ago Acute bronchitis, unspecified organism   Nature conservation officer at [employer], Tera Mater, MD      Future Appointments            In 1 year Nafziger, Kandee Keen, NP [facility] [place], [facility]

## 2018-11-15 NOTE — Progress Notes (Signed)
Patient presents to clinic today to establish care. He is a pleasant 57 year old male who  has a past medical history of ADHD (attention deficit hyperactivity disorder), Allergy, Asthma, Chronic kidney disease, ED (erectile dysfunction), Hyperlipidemia, and Migraine.    Acute Concerns: Establish Care/CPE  Chronic Issues: Hyperlipidemia - Takes Zocor 10 mg tablets and aspirin 81 mg daily Lab Results  Component Value Date   CHOL 158 08/23/2017   HDL 51.70 08/23/2017   LDLCALC 100 (H) 08/23/2017   TRIG 35.0 08/23/2017   CHOLHDL 3 08/23/2017   Bipolar Depression -well controlled with Lamictal 100 mg daily  H/o Kidney Stones -takes chlorthalidone 25 mg daily and Urocit-K  H/o Migraines -takes Topamax 100 mg daily. No recent migraines  ED- Viagra PRN   Health Maintenance: Dental -- Routine  Vision -- Routine  Immunizations -- utd Colonoscopy -- utd Diet: Tries to eat healthy  Exercise: Is very active at work   Past Medical History:  Diagnosis Date  . ADHD (attention deficit hyperactivity disorder)   . Allergy   . Asthma   . Chronic kidney disease   . ED (erectile dysfunction)   . Hyperlipidemia   . Migraine     Past Surgical History:  Procedure Laterality Date  . KIDNEY STONE SURGERY      Current Outpatient Medications on File Prior to Visit  Medication Sig Dispense Refill  . albuterol (PROVENTIL HFA;VENTOLIN HFA) 108 (90 Base) MCG/ACT inhaler Inhale 2 puffs into the lungs every 6 (six) hours as needed (for cough). 1 Inhaler 0  . aspirin 81 MG chewable tablet Chew 81 mg by mouth daily.    . cetirizine (ZYRTEC) 10 MG tablet Take 10 mg by mouth daily.      . chlorthalidone (HYGROTON) 25 MG tablet TAKE 1 TABLET BY MOUTH DAILY 100 tablet 0  . lamoTRIgine (LAMICTAL) 100 MG tablet TAKE 1 TABLET(100 MG) BY MOUTH DAILY 100 tablet 0  . potassium citrate (UROCIT-K) 10 MEQ (1080 MG) SR tablet TAKE 3 TABLETS BY MOUTH EVERY MORNING AND 2 TABLETS AT BEDTIME 450 tablet 0  .  sildenafil (REVATIO) 20 MG tablet Take 1 tablet (20 mg total) by mouth at bedtime as needed. 20 tablet 11  . simvastatin (ZOCOR) 10 MG tablet TAKE 1 TABLET BY MOUTH AT BEDTIME 100 tablet 0  . topiramate (TOPAMAX) 100 MG tablet TAKE 1 TABLET(100 MG) BY MOUTH DAILY 100 tablet 0  . SUMAtriptan (IMITREX) 20 MG/ACT nasal spray Place 1 spray (20 mg total) into the nose every 2 (two) hours as needed. (Patient not taking: Reported on 11/15/2018) 2 Inhaler 11   No current facility-administered medications on file prior to visit.     No Known Allergies  Family History  Adopted: Yes    Social History   Socioeconomic History  . Marital status: Married    Spouse name: Not on file  . Number of children: Not on file  . Years of education: Not on file  . Highest education level: Not on file  Occupational History    Employer: [employer]  Social Needs  . Financial resource strain: Not on file  . Food insecurity:    Worry: Not on file    Inability: Not on file  . Transportation needs:    Medical: Not on file    Non-medical: Not on file  Tobacco Use  . Smoking status: Never Smoker  . Smokeless tobacco: Never Used  Substance and Sexual Activity  . Alcohol use: No  .  Drug use: No  . Sexual activity: Not on file  Lifestyle  . Physical activity:    Days per week: Not on file    Minutes per session: Not on file  . Stress: Not on file  Relationships  . Social connections:    Talks on phone: Not on file    Gets together: Not on file    Attends religious service: Not on file    Active member of club or organization: Not on file    Attends meetings of clubs or organizations: Not on file    Relationship status: Not on file  . Intimate partner violence:    Fear of current or ex partner: Not on file    Emotionally abused: Not on file    Physically abused: Not on file    Forced sexual activity: Not on file  Other Topics Concern  . Not on file  Social History Narrative   He works for [employer].  Previous Environmental consultant.   He is married.  No tobacco alcohol or drug use.   He drinks 64 ounces of coffee a day      10/25/2017       Review of Systems  Constitutional: Negative.   HENT: Positive for hearing loss.   Eyes: Negative.   Respiratory: Negative.   Cardiovascular: Negative.   Gastrointestinal: Negative.   Genitourinary: Negative.   Musculoskeletal: Positive for back pain.  Skin: Negative.   Neurological: Negative.   Endo/Heme/Allergies: Negative.   Psychiatric/Behavioral: Negative.   All other systems reviewed and are negative.     BP 136/86   Temp 98.3 F (36.8 C)   Wt 172 lb (78 kg)   BMI 26.15 kg/m   Physical Exam Vitals signs and nursing note reviewed.  Constitutional:      General: He is not in acute distress.    Appearance: Normal appearance. He is well-developed and normal weight. He is not diaphoretic.  HENT:     Head: Normocephalic and atraumatic.     Right Ear: Tympanic membrane, ear canal and external ear normal.     Left Ear: Tympanic membrane, ear canal and external ear normal.     Nose: Nose normal. No congestion or rhinorrhea.     Mouth/Throat:     Pharynx: No oropharyngeal exudate or posterior oropharyngeal erythema.  Eyes:     General: No scleral icterus.       Right eye: No discharge.        Left eye: No discharge.     Extraocular Movements: Extraocular movements intact.     Conjunctiva/sclera: Conjunctivae normal.     Pupils: Pupils are equal, round, and reactive to light.  Neck:     Musculoskeletal: Normal range of motion and neck supple.     Thyroid: No thyromegaly.     Vascular: No carotid bruit.     Trachea: No tracheal deviation.  Cardiovascular:     Rate and Rhythm: Normal rate and regular rhythm.     Heart sounds: Normal heart sounds. No murmur. No friction rub. No gallop.   Pulmonary:     Effort: Pulmonary effort is normal. No respiratory distress.     Breath sounds: Normal breath sounds. No  stridor. No wheezing, rhonchi or rales.  Chest:     Chest wall: No tenderness.  Abdominal:     General: Bowel sounds are normal. There is no distension.     Palpations: Abdomen is soft. There is no mass.  Tenderness: There is no abdominal tenderness. There is no right CVA tenderness, guarding or rebound.     Hernia: No hernia is present.  Musculoskeletal: Normal range of motion.  Lymphadenopathy:     Cervical: No cervical adenopathy.  Skin:    General: Skin is warm and dry.     Capillary Refill: Capillary refill takes less than 2 seconds.     Coloration: Skin is not pale.     Findings: No erythema or rash.  Neurological:     General: No focal deficit present.     Mental Status: He is alert and oriented to person, place, and time.     Cranial Nerves: No cranial nerve deficit.     Coordination: Coordination normal.  Psychiatric:        Mood and Affect: Mood normal.        Behavior: Behavior normal.        Thought Content: Thought content normal.        Judgment: Judgment normal.    Assessment/Plan: 1. Routine general medical examination at a health care facility - Healthy male - Continue to exercise and eat healthy  - Basic metabolic panel - CBC with Differential/Platelet - Hepatic function panel - Lipid panel - TSH  2. Bipolar disorder, in full remission, most recent episode mixed (HCC) - Well controlled on Lamictal    3. Mixed hyperlipidemia - Consider increase in statin therapy  - Basic metabolic panel - CBC with Differential/Platelet - Hepatic function panel - Lipid panel - TSH  5. Prostate cancer screening  - PSA   Shirline Freesory Kiante Petrovich, NP

## 2019-11-06 ENCOUNTER — Other Ambulatory Visit: Payer: Self-pay | Admitting: Adult Health

## 2019-11-06 DIAGNOSIS — E7841 Elevated Lipoprotein(a): Secondary | ICD-10-CM

## 2019-11-06 DIAGNOSIS — Z87442 Personal history of urinary calculi: Secondary | ICD-10-CM

## 2019-11-08 NOTE — Telephone Encounter (Signed)
SENT TO THE PHARMACY BY E-SCRIBE FOR 90 DAYS.  PT HAS UPCOMING CPX. 

## 2019-11-21 ENCOUNTER — Ambulatory Visit: Payer: No Typology Code available for payment source | Admitting: Adult Health

## 2019-11-21 ENCOUNTER — Other Ambulatory Visit: Payer: Self-pay

## 2019-11-21 ENCOUNTER — Encounter: Payer: Self-pay | Admitting: Adult Health

## 2019-11-21 VITALS — BP 126/82 | Temp 97.9°F | Ht 68.5 in | Wt 167.0 lb

## 2019-11-21 DIAGNOSIS — Z23 Encounter for immunization: Secondary | ICD-10-CM

## 2019-11-21 DIAGNOSIS — N529 Male erectile dysfunction, unspecified: Secondary | ICD-10-CM

## 2019-11-21 DIAGNOSIS — I10 Essential (primary) hypertension: Secondary | ICD-10-CM | POA: Diagnosis not present

## 2019-11-21 DIAGNOSIS — Z0000 Encounter for general adult medical examination without abnormal findings: Secondary | ICD-10-CM

## 2019-11-21 DIAGNOSIS — Z125 Encounter for screening for malignant neoplasm of prostate: Secondary | ICD-10-CM

## 2019-11-21 DIAGNOSIS — F3178 Bipolar disorder, in full remission, most recent episode mixed: Secondary | ICD-10-CM

## 2019-11-21 DIAGNOSIS — E782 Mixed hyperlipidemia: Secondary | ICD-10-CM

## 2019-11-21 LAB — CBC WITH DIFFERENTIAL/PLATELET
Basophils Absolute: 0 10*3/uL (ref 0.0–0.1)
Basophils Relative: 0.6 % (ref 0.0–3.0)
Eosinophils Absolute: 0.2 10*3/uL (ref 0.0–0.7)
Eosinophils Relative: 3.1 % (ref 0.0–5.0)
HCT: 49.7 % (ref 39.0–52.0)
Hemoglobin: 17.1 g/dL — ABNORMAL HIGH (ref 13.0–17.0)
Lymphocytes Relative: 16.9 % (ref 12.0–46.0)
Lymphs Abs: 1.1 10*3/uL (ref 0.7–4.0)
MCHC: 34.3 g/dL (ref 30.0–36.0)
MCV: 92.5 fl (ref 78.0–100.0)
Monocytes Absolute: 0.5 10*3/uL (ref 0.1–1.0)
Monocytes Relative: 8.3 % (ref 3.0–12.0)
Neutro Abs: 4.5 10*3/uL (ref 1.4–7.7)
Neutrophils Relative %: 71.1 % (ref 43.0–77.0)
Platelets: 182 10*3/uL (ref 150.0–400.0)
RBC: 5.38 Mil/uL (ref 4.22–5.81)
RDW: 13.5 % (ref 11.5–15.5)
WBC: 6.3 10*3/uL (ref 4.0–10.5)

## 2019-11-21 LAB — COMPREHENSIVE METABOLIC PANEL
ALT: 28 U/L (ref 0–53)
AST: 26 U/L (ref 0–37)
Albumin: 4.7 g/dL (ref 3.5–5.2)
Alkaline Phosphatase: 55 U/L (ref 39–117)
BUN: 17 mg/dL (ref 6–23)
CO2: 30 mEq/L (ref 19–32)
Calcium: 9.7 mg/dL (ref 8.4–10.5)
Chloride: 100 mEq/L (ref 96–112)
Creatinine, Ser: 0.97 mg/dL (ref 0.40–1.50)
GFR: 79.83 mL/min (ref >60.00)
Glucose, Bld: 110 mg/dL — ABNORMAL HIGH (ref 70–99)
Potassium: 3.2 mEq/L — ABNORMAL LOW (ref 3.5–5.1)
Sodium: 138 mEq/L (ref 135–145)
Total Bilirubin: 1 mg/dL (ref 0.2–1.2)
Total Protein: 6.8 g/dL (ref 6.0–8.3)

## 2019-11-21 LAB — LIPID PANEL
Cholesterol: 159 mg/dL (ref 0–200)
HDL: 52.4 mg/dL (ref >39.00)
LDL Cholesterol: 101 mg/dL — ABNORMAL HIGH (ref 0–99)
NonHDL: 106.62
Total CHOL/HDL Ratio: 3
Triglycerides: 30 mg/dL (ref 0.0–149.0)
VLDL: 6 mg/dL (ref 0.0–40.0)

## 2019-11-21 LAB — TSH: TSH: 1.78 u[IU]/mL (ref 0.35–4.50)

## 2019-11-21 LAB — PSA: PSA: 0.52 ng/mL (ref 0.10–4.00)

## 2019-11-21 NOTE — Addendum Note (Signed)
Addended by: Miles Costain T on: 11/21/2019 07:40 AM   Modules accepted: Orders

## 2019-11-21 NOTE — Progress Notes (Signed)
Subjective:    Patient ID: Jimmy Holmes, male    DOB: 10-Sep-1961, 58 y.o.   MRN: [id number]  HPI Patient presents for yearly preventative medicine examination. He is a pleasant 58 year old male who  has a past medical history of ADHD (attention deficit hyperactivity disorder), Allergy, Asthma, Chronic kidney disease, ED (erectile dysfunction), Hyperlipidemia, and Migraine.  Hyperlipidemia - Takes Zocor 10 mg and ASA 81 mg. He denies myalgia or fatigue Lab Results  Component Value Date   CHOL 179 11/15/2018   HDL 53.80 11/15/2018   LDLCALC 111 (H) 11/15/2018   TRIG 67.0 11/15/2018   CHOLHDL 3 11/15/2018   Bipolar Depression -well controlled with Lamictal 100 mg daily  H/o kidney stones -takes chlorthalidone 25 mg daily and Urocit-K. No recent stones   H/o Migraines - Takes Topamax 100 mg daily. No recent migraines. Takes Imitrex PRN - has not used in multiple years.   ED - Viagra PRN   All immunizations and health maintenance protocols were reviewed with the patient and needed orders were placed. He is due for influenza vaccination   Appropriate screening laboratory values were ordered for the patient including screening of hyperlipidemia, renal function and hepatic function. If indicated by BPH, a PSA was ordered.  Medication reconciliation,  past medical history, social history, problem list and allergies were reviewed in detail with the patient  Goals were established with regard to weight loss, exercise, and  diet in compliance with medications.  He tries to eat healthy and is very active at work.  He is up-to-date on routine dental and vision screens as well as screening colonoscopy.  Wt Readings from Last 3 Encounters:  11/21/19 167 lb (75.8 kg)  11/15/18 172 lb (78 kg)  08/29/18 167 lb 8 oz (76 kg)   He has no acute complaints   Review of Systems  Constitutional: Negative.   HENT: Negative.   Eyes: Negative.   Respiratory: Negative.   Cardiovascular:  Negative.   Gastrointestinal: Negative.   Endocrine: Negative.   Genitourinary: Negative.   Musculoskeletal: Positive for arthralgias.  Skin: Negative.   Allergic/Immunologic: Negative.   Neurological: Negative.   Hematological: Negative.   Psychiatric/Behavioral: Negative.   All other systems reviewed and are negative.  Past Medical History:  Diagnosis Date  . ADHD (attention deficit hyperactivity disorder)   . Allergy   . Asthma   . Chronic kidney disease   . ED (erectile dysfunction)   . Hyperlipidemia   . Migraine     Social History   Socioeconomic History  . Marital status: Married    Spouse name: Not on file  . Number of children: Not on file  . Years of education: Not on file  . Highest education level: Not on file  Occupational History    Employer: [employer]  Tobacco Use  . Smoking status: Never Smoker  . Smokeless tobacco: Never Used  Substance and Sexual Activity  . Alcohol use: No  . Drug use: No  . Sexual activity: Not on file  Other Topics Concern  . 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   He is married.  No tobacco alcohol or drug use.   He drinks 64 ounces of coffee a day      10/25/2017      Social Determinants of Health   Financial Resource Strain:   . Difficulty of Paying Living Expenses: Not on file  Food  Insecurity:   . Worried About Programme researcher, broadcasting/film/videounning Out of Food in the Last Year: Not on file  . Ran Out of Food in the Last Year: Not on file  Transportation Needs:   . Lack of Transportation (Medical): Not on file  . Lack of Transportation (Non-Medical): Not on file  Physical Activity:   . Days of Exercise per Week: Not on file  . Minutes of Exercise per Session: Not on file  Stress:   . Feeling of Stress : Not on file  Social Connections:   . Frequency of Communication with Friends and Family: Not on file  . Frequency of Social Gatherings with Friends and Family: Not on file  .  Attends Religious Services: Not on file  . Active Member of Clubs or Organizations: Not on file  . Attends BankerClub or Organization Meetings: Not on file  . Marital Status: Not on file  Intimate Partner Violence:   . Fear of Current or Ex-Partner: Not on file  . Emotionally Abused: Not on file  . Physically Abused: Not on file  . Sexually Abused: Not on file    Past Surgical History:  Procedure Laterality Date  . KIDNEY STONE SURGERY      Family History  Adopted: Yes    No Known Allergies  Current Outpatient Medications on File Prior to Visit  Medication Sig Dispense Refill  . aspirin 81 MG chewable tablet Chew 81 mg by mouth daily.    . cetirizine (ZYRTEC) 10 MG tablet Take 1 tablet (10 mg total) by mouth daily. 90 tablet 3  . chlorthalidone (HYGROTON) 25 MG tablet TAKE 1 TABLET(25 MG) BY MOUTH DAILY 90 tablet 0  . lamoTRIgine (LAMICTAL) 100 MG tablet TAKE 1 TABLET(100 MG) BY MOUTH DAILY 90 tablet 0  . potassium citrate (UROCIT-K) 10 MEQ (1080 MG) SR tablet TAKE 3 TABLETS BY MOUTH EVERY MORNING AND 2 TABLETS AT BEDTIME 450 tablet 0  . sildenafil (REVATIO) 20 MG tablet Take 1 tablet (20 mg total) by mouth at bedtime as needed. 20 tablet 11  . simvastatin (ZOCOR) 10 MG tablet TAKE 1 TABLET(10 MG) BY MOUTH AT BEDTIME 90 tablet 0  . topiramate (TOPAMAX) 100 MG tablet TAKE 1 TABLET(100 MG) BY MOUTH DAILY 90 tablet 0  . albuterol (PROVENTIL HFA;VENTOLIN HFA) 108 (90 Base) MCG/ACT inhaler Inhale 2 puffs into the lungs every 6 (six) hours as needed (for cough). (Patient not taking: Reported on 11/21/2019) 1 Inhaler 0  . SUMAtriptan (IMITREX) 20 MG/ACT nasal spray Place 1 spray (20 mg total) into the nose every 2 (two) hours as needed. (Patient not taking: Reported on 11/15/2018) 2 Inhaler 11   No current facility-administered medications on file prior to visit.    BP 126/82   Temp 97.9 F (36.6 C) (Temporal)   Ht 5' 8.5" (1.74 m)   Wt 167 lb (75.8 kg)   BMI 25.02 kg/m         Objective:   Physical Exam Vitals and nursing note reviewed.  Constitutional:      General: He is not in acute distress.    Appearance: Normal appearance. He is well-developed. He is not diaphoretic.  HENT:     Head: Normocephalic and atraumatic.     Right Ear: Tympanic membrane, ear canal and external ear normal. There is no impacted cerumen.     Left Ear: Tympanic membrane, ear canal and external ear normal. There is no impacted cerumen.     Nose: Nose normal. No congestion or rhinorrhea.  Mouth/Throat:     Mouth: Mucous membranes are moist.     Pharynx: Oropharynx is clear. No oropharyngeal exudate.  Eyes:     General:        Right eye: No discharge.        Left eye: No discharge.     Conjunctiva/sclera: Conjunctivae normal.     Pupils: Pupils are equal, round, and reactive to light.  Neck:     Thyroid: No thyromegaly.     Trachea: No tracheal deviation.  Cardiovascular:     Rate and Rhythm: Normal rate and regular rhythm.     Pulses: Normal pulses.     Heart sounds: Normal heart sounds. No murmur. No friction rub. No gallop.   Pulmonary:     Effort: Pulmonary effort is normal. No respiratory distress.     Breath sounds: Normal breath sounds. No wheezing or rales.  Chest:     Chest wall: No tenderness.  Abdominal:     General: Bowel sounds are normal. There is no distension.     Palpations: Abdomen is soft. There is no mass.     Tenderness: There is no abdominal tenderness. There is no right CVA tenderness, left CVA tenderness, guarding or rebound.     Hernia: No hernia is present.  Musculoskeletal:        General: No swelling, tenderness, deformity or signs of injury. Normal range of motion.     Right lower leg: No edema.     Left lower leg: No edema.  Lymphadenopathy:     Cervical: No cervical adenopathy.  Skin:    General: Skin is warm and dry.     Coloration: Skin is not jaundiced or pale.     Findings: No bruising, erythema, lesion or rash.  Neurological:      General: No focal deficit present.     Mental Status: He is alert and oriented to person, place, and time. Mental status is at baseline.     Cranial Nerves: No cranial nerve deficit.     Sensory: No sensory deficit.     Motor: No weakness.     Coordination: Coordination normal.     Gait: Gait normal.     Deep Tendon Reflexes: Reflexes normal.  Psychiatric:        Mood and Affect: Mood normal.        Behavior: Behavior normal.        Thought Content: Thought content normal.        Judgment: Judgment normal.       Assessment & Plan:  1. Routine general medical examination at a health care facility - Flu vaccination given  - continue to eat healthy and exercise - Follow up in one year or sooner if needed - CBC with Differential/Platelet - CMP - Lipid panel - TSH   2. Mixed hyperlipidemia - Consider increase in statin  - CBC with Differential/Platelet - CMP - Lipid panel - TSH  3. Prostate cancer screening  - PSA  4. Erectile dysfunction, unspecified erectile dysfunction type - Continue with Viagra PRN   5. Bipolar disorder, in full remission, most recent episode mixed ([place]) - Continue with lamictal   Dorothyann Peng, NP

## 2019-11-21 NOTE — Patient Instructions (Signed)
It was great seeing you today!   We will follow up with you regarding your blood work   Please follow up in one year or sooner if needed  Health Maintenance, Male A healthy lifestyle and preventative care can promote health and wellness.  Maintain regular health, dental, and eye exams.  Eat a healthy diet. Foods like vegetables, fruits, whole grains, low-fat dairy products, and lean protein foods contain the nutrients you need and are low in calories. Decrease your intake of foods high in solid fats, added sugars, and salt. Get information about a proper diet from your health care provider, if necessary.  Regular physical exercise is one of the most important things you can do for your health. Most adults should get at least 150 minutes of moderate-intensity exercise (any activity that increases your heart rate and causes you to sweat) each week. In addition, most adults need muscle-strengthening exercises on 2 or more days a week.   Maintain a healthy weight. The body mass index (BMI) is a screening tool to identify possible weight problems. It provides an estimate of body fat based on height and weight. Your health care provider can find your BMI and can help you achieve or maintain a healthy weight. For males 20 years and older:  A BMI below 18.5 is considered underweight.  A BMI of 18.5 to 24.9 is normal.  A BMI of 25 to 29.9 is considered overweight.  A BMI of 30 and above is considered obese.  Maintain normal blood lipids and cholesterol by exercising and minimizing your intake of saturated fat. Eat a balanced diet with plenty of fruits and vegetables. Blood tests for lipids and cholesterol should begin at age 20 and be repeated every 5 years. If your lipid or cholesterol levels are high, you are over age 50, or you are at high risk for heart disease, you may need your cholesterol levels checked more frequently.Ongoing high lipid and cholesterol levels should be treated with  medicines if diet and exercise are not working.  If you smoke, find out from your health care provider how to quit. If you do not use tobacco, do not start.  Lung cancer screening is recommended for adults aged 55-80 years who are at high risk for developing lung cancer because of a history of smoking. A yearly low-dose CT scan of the lungs is recommended for people who have at least a 30-pack-year history of smoking and are current smokers or have quit within the past 15 years. A pack year of smoking is smoking an average of 1 pack of cigarettes a day for 1 year (for example, a 30-pack-year history of smoking could mean smoking 1 pack a day for 30 years or 2 packs a day for 15 years). Yearly screening should continue until the smoker has stopped smoking for at least 15 years. Yearly screening should be stopped for people who develop a health problem that would prevent them from having lung cancer treatment.  If you choose to drink alcohol, do not have more than 2 drinks per day. One drink is considered to be 12 oz (360 mL) of beer, 5 oz (150 mL) of wine, or 1.5 oz (45 mL) of liquor.  Avoid the use of street drugs. Do not share needles with anyone. Ask for help if you need support or instructions about stopping the use of drugs.  High blood pressure causes heart disease and increases the risk of stroke. High blood pressure is more likely to develop   in:  People who have blood pressure in the end of the normal range (100-139/85-89 mm Hg).  People who are overweight or obese.  People who are African American.  If you are 64-63 years of age, have your blood pressure checked every 3-5 years. If you are 52 years of age or older, have your blood pressure checked every year. You should have your blood pressure measured twice--once when you are at a hospital or clinic, and once when you are not at a hospital or clinic. Record the average of the two measurements. To check your blood pressure when you are not at a  hospital or clinic, you can use:  An automated blood pressure machine at a pharmacy.  A home blood pressure monitor.  If you are 12-45 years old, ask your health care provider if you should take aspirin to prevent heart disease.  Diabetes screening involves taking a blood sample to check your fasting blood sugar level. This should be done once every 3 years after age 54 if you are at a normal weight and without risk factors for diabetes. Testing should be considered at a younger age or be carried out more frequently if you are overweight and have at least 1 risk factor for diabetes.  Colorectal cancer can be detected and often prevented. Most routine colorectal cancer screening begins at the age of 65 and continues through age 57. However, your health care provider may recommend screening at an earlier age if you have risk factors for colon cancer. On a yearly basis, your health care provider may provide home test kits to check for hidden blood in the stool. A small camera at the end of a tube may be used to directly examine the colon (sigmoidoscopy or colonoscopy) to detect the earliest forms of colorectal cancer. Talk to your health care provider about this at age 57 when routine screening begins. A direct exam of the colon should be repeated every 5-10 years through age 55, unless early forms of precancerous polyps or small growths are found.  People who are at an increased risk for hepatitis B should be screened for this virus. You are considered at high risk for hepatitis B if:  You were born in a country where hepatitis B occurs often. Talk with your health care provider about which countries are considered high risk.  Your parents were born in a high-risk country and you have not received a shot to protect against hepatitis B (hepatitis B vaccine).  You have HIV or AIDS.  You use needles to inject street drugs.  You live with, or have sex with, someone who has hepatitis B.  You are a  man who has sex with other men (MSM).  You get hemodialysis treatment.  You take certain medicines for conditions like cancer, organ transplantation, and autoimmune conditions.  Hepatitis C blood testing is recommended for all people born from [birth year] through 1963 and any individual with known risk factors for hepatitis C.  Healthy men should no longer receive prostate-specific antigen (PSA) blood tests as part of routine cancer screening. Talk to your health care provider about prostate cancer screening.  Testicular cancer screening is not recommended for adolescents or adult males who have no symptoms. Screening includes self-exam, a health care provider exam, and other screening tests. Consult with your health care provider about any symptoms you have or any concerns you have about testicular cancer.  Practice safe sex. Use condoms and avoid high-risk sexual practices to reduce the  spread of sexually transmitted infections (STIs).  You should be screened for STIs, including gonorrhea and chlamydia if:  You are sexually active and are younger than 24 years.  You are older than 24 years, and your health care provider tells you that you are at risk for this type of infection.  Your sexual activity has changed since you were last screened, and you are at an increased risk for chlamydia or gonorrhea. Ask your health care provider if you are at risk.  If you are at risk of being infected with HIV, it is recommended that you take a prescription medicine daily to prevent HIV infection. This is called pre-exposure prophylaxis (PrEP). You are considered at risk if:  You are a man who has sex with other men (MSM).  You are a heterosexual man who is sexually active with multiple partners.  You take drugs by injection.  You are sexually active with a partner who has HIV.  Talk with your health care provider about whether you are at high risk of being infected with HIV. If you choose to begin PrEP,  you should first be tested for HIV. You should then be tested every 3 months for as long as you are taking PrEP.  Use sunscreen. Apply sunscreen liberally and repeatedly throughout the day. You should seek shade when your shadow is shorter than you. Protect yourself by wearing long sleeves, pants, a wide-brimmed hat, and sunglasses year round whenever you are outdoors.  Tell your health care provider of new moles or changes in moles, especially if there is a change in shape or color. Also, tell your health care provider if a mole is larger than the size of a pencil eraser.  A one-time screening for abdominal aortic aneurysm (AAA) and surgical repair of large AAAs by ultrasound is recommended for men aged 73-75 years who are current or former smokers.  Stay current with your vaccines (immunizations).   This information is not intended to replace advice given to you by your health care provider. Make sure you discuss any questions you have with your health care provider.   Document Released: 05/07/2008 Document Revised: 11/30/2014 Document Reviewed: 04/06/2011 Elsevier Interactive Patient Education [employer].

## 2019-11-30 ENCOUNTER — Other Ambulatory Visit: Payer: Self-pay | Admitting: Adult Health

## 2019-12-01 ENCOUNTER — Encounter: Payer: Self-pay | Admitting: Adult Health

## 2019-12-01 DIAGNOSIS — E7841 Elevated Lipoprotein(a): Secondary | ICD-10-CM

## 2019-12-01 DIAGNOSIS — Z820 Family history of epilepsy and other diseases of the nervous system: Secondary | ICD-10-CM

## 2019-12-01 DIAGNOSIS — Z87442 Personal history of urinary calculi: Secondary | ICD-10-CM

## 2019-12-04 MED ORDER — TOPIRAMATE 100 MG PO TABS: ORAL_TABLET | ORAL | 2 refills | Status: DC

## 2019-12-04 MED ORDER — CHLORTHALIDONE 25 MG PO TABS: ORAL_TABLET | ORAL | 3 refills | Status: DC

## 2019-12-04 MED ORDER — SIMVASTATIN 10 MG PO TABS: ORAL_TABLET | ORAL | 2 refills | Status: DC

## 2019-12-04 MED ORDER — POTASSIUM CITRATE ER 10 MEQ (1080 MG) PO TBCR: EXTENDED_RELEASE_TABLET | ORAL | 2 refills | Status: DC

## 2019-12-04 MED ORDER — LAMOTRIGINE 100 MG PO TABS: ORAL_TABLET | ORAL | 2 refills | Status: DC

## 2019-12-04 MED ORDER — SILDENAFIL CITRATE 20 MG PO TABS: 20.0000 mg | ORAL_TABLET | Freq: Every evening | ORAL | 11 refills | Status: DC | PRN

## 2019-12-05 MED ORDER — SUMATRIPTAN 20 MG/ACT NA SOLN: 1.0000 | NASAL | 11 refills | Status: DC | PRN

## 2020-08-21 ENCOUNTER — Other Ambulatory Visit: Payer: Self-pay | Admitting: Adult Health

## 2020-08-21 DIAGNOSIS — Z87442 Personal history of urinary calculi: Secondary | ICD-10-CM

## 2020-08-21 DIAGNOSIS — E7841 Elevated Lipoprotein(a): Secondary | ICD-10-CM

## 2020-09-17 ENCOUNTER — Telehealth: Payer: Self-pay | Admitting: *Deleted

## 2020-09-17 NOTE — Telephone Encounter (Signed)
Spoke with patient. Per patient this incident happened at work and he would have to go through [employer]. Patient reports he has cleaned the wound and is watching it

## 2020-09-17 NOTE — Telephone Encounter (Signed)
I would still recommend he be seen somewhere, we like to take xrays to make sure there is no foreign body and he will likely need antibiotics puncture wounds are notorious for getting infected very quickly.

## 2020-09-17 NOTE — Telephone Encounter (Signed)
Patient spoke with nurse triage. Patient reports he stepped on a nail this morning. Was clean, nail was clean. Tetanus is current. May need a prescription. Caller declined Triage. Patient had a Td 11/23/2012. Please advise

## 2020-09-17 NOTE — Telephone Encounter (Signed)
Can we put him on tomorrow after noon, I would like to take a look at the wound before prescribing abx

## 2020-09-17 NOTE — Telephone Encounter (Signed)
Recalled patient and informed him per [facility] he would still recommend he be seen somewhere, we like to take xrays to make sure there is no foreign body and he will likely need antibiotics puncture wounds are notorious for getting infected very quickly. Patient verbalized understanding he is in [place], [place] doing a "job" and will go through his job.

## 2020-09-22 DIAGNOSIS — Z20822 Contact with and (suspected) exposure to covid-19: Secondary | ICD-10-CM | POA: Diagnosis not present

## 2020-09-29 ENCOUNTER — Other Ambulatory Visit: Payer: Self-pay

## 2020-09-29 ENCOUNTER — Emergency Department
Admission: RE | Admit: 2020-09-29 | Discharge: 2020-09-29 | Disposition: A | Discharge status: Home / Self Care | Payer: Self-pay | Source: Ambulatory Visit | Attending: Family Medicine | Admitting: Family Medicine

## 2020-09-29 VITALS — BP 152/81 | HR 63 | Temp 98.9°F | Ht 68.0 in | Wt 153.0 lb

## 2020-09-29 DIAGNOSIS — J069 Acute upper respiratory infection, unspecified: Secondary | ICD-10-CM

## 2020-09-29 MED ORDER — ALBUTEROL SULFATE HFA 108 (90 BASE) MCG/ACT IN AERS: 2.0000 | INHALATION_SPRAY | Freq: Four times a day (QID) | RESPIRATORY_TRACT | 0 refills | Status: AC | PRN

## 2020-09-29 MED ORDER — PREDNISONE 20 MG PO TABS: ORAL_TABLET | ORAL | 0 refills | Status: DC

## 2020-09-29 MED ORDER — DOXYCYCLINE HYCLATE 100 MG PO CAPS: ORAL_CAPSULE | ORAL | 0 refills | Status: DC

## 2020-09-29 NOTE — ED Provider Notes (Signed)
[facility]    CSN: [id number] Arrival date & time: 09/29/20  1443      History   Chief Complaint Chief Complaint  Patient presents with  . Cough    HPI Jimmy Holmes is a 59 y.o. male.   About 9 days ago patient developed nasal drainage and productive cough.  He has also developed low grade fever 99.5, hyalgias, and fatigue.  His appetite is decreased but he denies nausea/vomiting or diarrhea.  He denies chest tightness, shortness of breath, and changes in taste/smell.  He has a history of mild reactive airways disease that manifests during RUI's and has an albuterol inhaler to use. He has a history of seasonal rhinitis.   The history is provided by the patient.    Past Medical History:  Diagnosis Date  . ADHD (attention deficit hyperactivity disorder)   . Allergy   . Asthma   . Chronic kidney disease   . ED (erectile dysfunction)   . Hyperlipidemia   . Migraine     Patient Active Problem List   Diagnosis Date Noted  . Pneumonia of left lower lobe due to infectious organism 08/29/2018  . Decreased libido 08/23/2017  . Chronic right-sided low back pain without sciatica 08/23/2017  . Routine general medical examination at a health care facility 08/23/2017  . Hx of migraine headaches 09/29/2012  . Erectile dysfunction 11/11/2011  . Hyperlipidemia 09/17/2008  . Bipolar affective disorder (HCC) 09/17/2008  . Essential hypertension 09/23/2007  . NEPHROLITHIASIS, HX OF 09/23/2007  . Allergic rhinitis 07/14/2007    Past Surgical History:  Procedure Laterality Date  . KIDNEY STONE SURGERY         Home Medications    Prior to Admission medications   Medication Sig Start Date End Date Taking? Authorizing Provider  aspirin 81 MG chewable tablet Chew 81 mg by mouth daily.   Yes [provider]  cetirizine (ZYRTEC) 10 MG tablet Take 1 tablet (10 mg total) by mouth daily. 11/15/18  Yes Nafziger, Kandee Keen, NP  chlorthalidone (HYGROTON) 25 MG  tablet TAKE 1 TABLET(25 MG) BY MOUTH DAILY 12/04/19  Yes Nafziger, Kandee Keen, NP  lamoTRIgine (LAMICTAL) 100 MG tablet TAKE 1 TABLET BY MOUTH DAILY 08/21/20  Yes Nafziger, Kandee Keen, NP  potassium citrate (UROCIT-K) 10 MEQ (1080 MG) SR tablet TAKE 3 TABLETS BY MOUTH EVERY MORNING AND 2 TABLETS AT BEDTIME 08/21/20  Yes Nafziger, Kandee Keen, NP  sildenafil (REVATIO) 20 MG tablet Take 1 tablet (20 mg total) by mouth at bedtime as needed. 12/04/19  Yes Nafziger, Kandee Keen, NP  simvastatin (ZOCOR) 10 MG tablet TAKE 1 TABLET(10 MG) BY MOUTH AT BEDTIME 08/21/20  Yes Nafziger, Kandee Keen, NP  SUMAtriptan (IMITREX) 20 MG/ACT nasal spray Place 1 spray (20 mg total) into the nose every 2 (two) hours as needed. 12/05/19  Yes Nafziger, Kandee Keen, NP  topiramate (TOPAMAX) 100 MG tablet TAKE 1 TABLET BY MOUTH DAILY 08/21/20  Yes Nafziger, Kandee Keen, NP  albuterol (VENTOLIN HFA) 108 (90 Base) MCG/ACT inhaler Inhale 2 puffs into the lungs every 6 (six) hours as needed (for cough). 09/29/20   Lattie Haw, MD  doxycycline (VIBRAMYCIN) 100 MG capsule Take one cap PO Q12hr with food. 09/29/20   Lattie Haw, MD  predniSONE (DELTASONE) 20 MG tablet Take one tab by mouth twice daily for 4 days, then one daily. Take with food. 09/29/20   Lattie Haw, MD    Family History Family History  Adopted: Yes    Social History Social History  Tobacco Use  . Smoking status: Never Smoker  . Smokeless tobacco: Never Used  Vaping Use  . Vaping Use: Never used  Substance Use Topics  . Alcohol use: No  . Drug use: No     Allergies   Patient has no known allergies.   Review of Systems Review of Systems  No sore throat + cough No pleuritic pain No wheezing + nasal congestion + post-nasal drainage No sinus pain/pressure No itchy/red eyes No earache No hemoptysis No SOB + fever, + chills No nausea No vomiting No abdominal pain No diarrhea No urinary symptoms No skin rash + fatigue No myalgias No headache     Physical Exam Triage  Vital Signs ED Triage Vitals  Enc Vitals Group     BP 09/29/20 1605 (!) 152/81     Pulse Rate 09/29/20 1605 63     Resp --      Temp 09/29/20 1605 98.9 F (37.2 C)     Temp Source 09/29/20 1605 Oral     SpO2 09/29/20 1605 98 %     Weight 09/29/20 1604 153 lb (69.4 kg)     Height 09/29/20 1604 5\' 8"  (1.727 m)     Head Circumference --      Peak Flow --      Pain Score 09/29/20 1604 0     Pain Loc --      Pain Edu? --      Excl. in GC? --    No data found.  Updated Vital Signs BP (!) 152/81 (BP Location: Left Arm)   Pulse 63   Temp 98.9 F (37.2 C) (Oral)   Ht 5\' 8"  (1.727 m)   Wt 69.4 kg   SpO2 98%   BMI 23.26 kg/m   Visual Acuity Right Eye Distance:   Left Eye Distance:   Bilateral Distance:    Right Eye Near:   Left Eye Near:    Bilateral Near:     Physical Exam Nursing notes and Vital Signs reviewed. Appearance:  Patient appears stated age, and in no acute distress Eyes:  Pupils are equal, round, and reactive to light and accomodation.  Extraocular movement is intact.  Conjunctivae are not inflamed  Ears:  Canals normal.  Tympanic membranes normal.  Nose:  Mildly congested turbinates.  No sinus tenderness.  Pharynx:  Normal Neck:  Supple.  Mildly enlarged lateral nodes are present, tender to palpation on the left.   Lungs:  Clear to auscultation.  Breath sounds are equal.  Moving air well. Heart:  Regular rate and rhythm without murmurs, rubs, or gallops.  Abdomen:  Nontender without masses or hepatosplenomegaly.  Bowel sounds are present.  No CVA or flank tenderness.  Extremities:  No edema.  Skin:  No rash present.   UC Treatments / Results  Labs (all labs ordered are listed, but only abnormal results are displayed) Labs Reviewed - No data to display  EKG   Radiology No results found.  Procedures Procedures (including critical care time)  Medications Ordered in UC Medications - No data to display  Initial Impression / Assessment and Plan /  UC Course  I have reviewed the triage vital signs and the nursing notes.  Pertinent labs & imaging results that were available during my care of the patient were reviewed by me and considered in my medical decision making (see chart for details).    There is no evidence of bacterial infection today.  Because of his history of mild reactive  airways disease, Rx written for prednisone burst/taper. Refill albuterol MDI. Followup with [facility] if not improved in about 10 days.   Final Clinical Impressions(s) / UC Diagnoses   Final diagnoses:  Viral URI with cough     Discharge Instructions     Take plain guaifenesin (1200mg  extended release tabs such as Mucinex) twice daily, with plenty of water, for cough and congestion.  Get adequate rest.   May use Afrin nasal spray (or generic oxymetazoline) each morning for about 5 days and then discontinue.  Also recommend using saline nasal spray several times daily and saline nasal irrigation (AYR is a common brand).  Use Flonase nasal spray each morning after using Afrin nasal spray and saline nasal irrigation. Try warm salt water gargles for sore throat.  Stop all antihistamines for now, and other non-prescription cough/cold preparations. May take Delsym Cough Suppressant ("12 Hour Cough Relief") at bedtime for nighttime cough.  Begin Doxycycline if not improving about one week or if persistent fever develops. (Given a prescription to hold, with an expiration date)      ED Prescriptions    Medication Sig Dispense Auth. Provider   predniSONE (DELTASONE) 20 MG tablet Take one tab by mouth twice daily for 4 days, then one daily. Take with food. 12 tablet , MD   doxycycline (VIBRAMYCIN) 100 MG capsule Take one cap PO Q12hr with food. 14 capsule Lattie Haw, MD   albuterol (VENTOLIN HFA) 108 (90 Base) MCG/ACT inhaler Inhale 2 puffs into the lungs every 6 (six) hours as needed (for cough). 1 each Lattie Haw, MD         Lattie Haw, MD 10/02/20 [phone]

## 2020-09-29 NOTE — ED Triage Notes (Signed)
x1 week. Pt states that he has a cough and nasal congestion. Pt states that he did a covid pcr test which was negative. Pt states that he is not vaccinated.

## 2020-09-29 NOTE — Discharge Instructions (Addendum)
Take plain guaifenesin (1200mg  extended release tabs such as Mucinex) twice daily, with plenty of water, for cough and congestion.  Get adequate rest.   May use Afrin nasal spray (or generic oxymetazoline) each morning for about 5 days and then discontinue.  Also recommend using saline nasal spray several times daily and saline nasal irrigation (AYR is a common brand).  Use Flonase nasal spray each morning after using Afrin nasal spray and saline nasal irrigation. Try warm salt water gargles for sore throat.  Stop all antihistamines for now, and other non-prescription cough/cold preparations. May take Delsym Cough Suppressant ("12 Hour Cough Relief") at bedtime for nighttime cough.  Begin Doxycycline if not improving about one week or if persistent fever develops.

## 2020-11-17 ENCOUNTER — Other Ambulatory Visit: Payer: Self-pay | Admitting: Adult Health

## 2020-11-20 ENCOUNTER — Other Ambulatory Visit: Payer: Self-pay

## 2020-11-21 ENCOUNTER — Encounter: Payer: Self-pay | Admitting: Adult Health

## 2020-11-21 ENCOUNTER — Ambulatory Visit (INDEPENDENT_AMBULATORY_CARE_PROVIDER_SITE_OTHER): Payer: BC Managed Care – PPO | Admitting: Adult Health

## 2020-11-21 VITALS — BP 120/70 | HR 82 | Ht 68.0 in | Wt 165.4 lb

## 2020-11-21 DIAGNOSIS — F3178 Bipolar disorder, in full remission, most recent episode mixed: Secondary | ICD-10-CM

## 2020-11-21 DIAGNOSIS — Z125 Encounter for screening for malignant neoplasm of prostate: Secondary | ICD-10-CM | POA: Diagnosis not present

## 2020-11-21 DIAGNOSIS — Z23 Encounter for immunization: Secondary | ICD-10-CM | POA: Diagnosis not present

## 2020-11-21 DIAGNOSIS — N529 Male erectile dysfunction, unspecified: Secondary | ICD-10-CM | POA: Diagnosis not present

## 2020-11-21 DIAGNOSIS — Z0000 Encounter for general adult medical examination without abnormal findings: Secondary | ICD-10-CM

## 2020-11-21 DIAGNOSIS — E782 Mixed hyperlipidemia: Secondary | ICD-10-CM | POA: Diagnosis not present

## 2020-11-21 DIAGNOSIS — Z8669 Personal history of other diseases of the nervous system and sense organs: Secondary | ICD-10-CM

## 2020-11-21 DIAGNOSIS — Z87442 Personal history of urinary calculi: Secondary | ICD-10-CM

## 2020-11-21 LAB — LIPID PANEL
Cholesterol: 163 mg/dL (ref 0–200)
HDL: 49.6 mg/dL (ref >39.00)
LDL Cholesterol: 105 mg/dL — ABNORMAL HIGH (ref 0–99)
NonHDL: 113.31
Total CHOL/HDL Ratio: 3
Triglycerides: 41 mg/dL (ref 0.0–149.0)
VLDL: 8.2 mg/dL (ref 0.0–40.0)

## 2020-11-21 LAB — COMPREHENSIVE METABOLIC PANEL
ALT: 28 U/L (ref 0–53)
AST: 23 U/L (ref 0–37)
Albumin: 4.7 g/dL (ref 3.5–5.2)
Alkaline Phosphatase: 67 U/L (ref 39–117)
BUN: 28 mg/dL — ABNORMAL HIGH (ref 6–23)
CO2: 31 mEq/L (ref 19–32)
Calcium: 9.7 mg/dL (ref 8.4–10.5)
Chloride: 103 mEq/L (ref 96–112)
Creatinine, Ser: 1 mg/dL (ref 0.40–1.50)
GFR: 83.36 mL/min (ref >60.00)
Glucose, Bld: 117 mg/dL — ABNORMAL HIGH (ref 70–99)
Potassium: 3.8 mEq/L (ref 3.5–5.1)
Sodium: 141 mEq/L (ref 135–145)
Total Bilirubin: 0.6 mg/dL (ref 0.2–1.2)
Total Protein: 7 g/dL (ref 6.0–8.3)

## 2020-11-21 LAB — CBC WITH DIFFERENTIAL/PLATELET
Basophils Absolute: 0.1 10*3/uL (ref 0.0–0.1)
Basophils Relative: 0.9 % (ref 0.0–3.0)
Eosinophils Absolute: 0.2 10*3/uL (ref 0.0–0.7)
Eosinophils Relative: 3.2 % (ref 0.0–5.0)
HCT: 47.5 % (ref 39.0–52.0)
Hemoglobin: 16.2 g/dL (ref 13.0–17.0)
Lymphocytes Relative: 13.1 % (ref 12.0–46.0)
Lymphs Abs: 0.9 10*3/uL (ref 0.7–4.0)
MCHC: 34.1 g/dL (ref 30.0–36.0)
MCV: 91.7 fl (ref 78.0–100.0)
Monocytes Absolute: 0.5 10*3/uL (ref 0.1–1.0)
Monocytes Relative: 6.8 % (ref 3.0–12.0)
Neutro Abs: 5.3 10*3/uL (ref 1.4–7.7)
Neutrophils Relative %: 76 % (ref 43.0–77.0)
Platelets: 215 10*3/uL (ref 150.0–400.0)
RBC: 5.18 Mil/uL (ref 4.22–5.81)
RDW: 13.9 % (ref 11.5–15.5)
WBC: 6.9 10*3/uL (ref 4.0–10.5)

## 2020-11-21 LAB — TSH: TSH: 1.33 u[IU]/mL (ref 0.35–4.50)

## 2020-11-21 LAB — PSA: PSA: 0.58 ng/mL (ref 0.10–4.00)

## 2020-11-21 MED ORDER — SILDENAFIL CITRATE 20 MG PO TABS: 20.0000 mg | ORAL_TABLET | Freq: Every evening | ORAL | 11 refills | Status: DC | PRN

## 2020-11-21 NOTE — Patient Instructions (Signed)
It was great seeing you today   We will follow up with you regarding your blood work   Please follow up in 6 weeks or so for a nurse visit to get your second shingles vaccination

## 2020-11-21 NOTE — Progress Notes (Signed)
Subjective:    Patient ID: Jimmy Holmes, male    DOB: Dec 10, 1960, 59 y.o.   MRN: [id number]  HPI Patient presents for yearly preventative medicine examination. He is a pleasant 59 year old male who  has a past medical history of ADHD (attention deficit hyperactivity disorder), Allergy, Asthma, Chronic kidney disease, ED (erectile dysfunction), Hyperlipidemia, and Migraine.  Hyperlipidemia -takes Zocor 10 mg and aspirin 81 mg.  He denies myalgia or fatigue Lab Results  Component Value Date   CHOL 159 11/21/2019   HDL 52.40 11/21/2019   LDLCALC 101 (H) 11/21/2019   TRIG 30.0 11/21/2019   CHOLHDL 3 11/21/2019   Bipolar Depression -controlled with Lamictal 100 mg daily.  He denies depressive symptoms or suicidal ideation  H/o kidney stones -prescribed chlorthalidone 25 mg daily and Urocit-K.  He denies any recent episodes  Migraine Headaches -prescribed Topamax 100 mg daily, in the past he has been very well controlled with this medication and has not had to use Imitrex in multiple years  ED-uses Viagra as needed  All immunizations and health maintenance protocols were reviewed with the patient and needed orders were placed. He refuses flu vaccination and covid vaccinations. He would like to get the shingles vaccination   Appropriate screening laboratory values were ordered for the patient including screening of hyperlipidemia, renal function and hepatic function. If indicated by BPH, a PSA was ordered.  Medication reconciliation,  past medical history, social history, problem list and allergies were reviewed in detail with the patient  Goals were established with regard to weight loss, exercise, and  diet in compliance with medications.  He tries to eat a heart healthy diet on a routine basis and is very active at work.  Wt Readings from Last 3 Encounters:  11/21/20 165 lb 6 oz (75 kg)  09/29/20 153 lb (69.4 kg)  11/21/19 167 lb (75.8 kg)   He is up-to-date on routine colon  cancer screening   Review of Systems  Constitutional: Negative.   HENT: Negative.   Eyes: Negative.   Respiratory: Negative.   Cardiovascular: Negative.   Gastrointestinal: Negative.   Endocrine: Negative.   Genitourinary: Negative.   Musculoskeletal: Positive for arthralgias.  Skin: Negative.   Allergic/Immunologic: Negative.   Neurological: Negative.   Hematological: Negative.   Psychiatric/Behavioral: Negative.   All other systems reviewed and are negative.  Past Medical History:  Diagnosis Date  . ADHD (attention deficit hyperactivity disorder)   . Allergy   . Asthma   . Chronic kidney disease   . ED (erectile dysfunction)   . Hyperlipidemia   . Migraine     Social History   Socioeconomic History  . Marital status: Married    Spouse name: Not on file  . Number of children: Not on file  . Years of education: Not on file  . Highest education level: Not on file  Occupational History    Employer: [employer]  Tobacco Use  . Smoking status: Never Smoker  . Smokeless tobacco: Never Used  Vaping Use  . Vaping Use: Never used  Substance and Sexual Activity  . Alcohol use: No  . Drug use: No  . Sexual activity: Not on file  Other Topics Concern  . 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   He is married.  No tobacco alcohol or drug use.   He drinks 64 ounces of coffee a day  10/25/2017      Social Determinants of Health   Financial Resource Strain: Not on file  Food Insecurity: Not on file  Transportation Needs: Not on file  Physical Activity: Not on file  Stress: Not on file  Social Connections: Not on file  Intimate Partner Violence: Not on file    Past Surgical History:  Procedure Laterality Date  . KIDNEY STONE SURGERY      Family History  Adopted: Yes    No Known Allergies  Current Outpatient Medications on File Prior to Visit  Medication Sig Dispense Refill  . albuterol  (VENTOLIN HFA) 108 (90 Base) MCG/ACT inhaler Inhale 2 puffs into the lungs every 6 (six) hours as needed (for cough). 1 each 0  . aspirin 81 MG chewable tablet Chew 81 mg by mouth daily.    . cetirizine (ZYRTEC) 10 MG tablet Take 1 tablet (10 mg total) by mouth daily. 90 tablet 3  . chlorthalidone (HYGROTON) 25 MG tablet TAKE 1 TABLET(25 MG) BY MOUTH DAILY 90 tablet 3  . lamoTRIgine (LAMICTAL) 100 MG tablet TAKE 1 TABLET BY MOUTH DAILY 90 tablet 2  . potassium citrate (UROCIT-K) 10 MEQ (1080 MG) SR tablet TAKE 3 TABLETS BY MOUTH EVERY MORNING AND 2 TABLETS AT BEDTIME 450 tablet 2  . sildenafil (REVATIO) 20 MG tablet Take 1 tablet (20 mg total) by mouth at bedtime as needed. 20 tablet 11  . simvastatin (ZOCOR) 10 MG tablet TAKE 1 TABLET(10 MG) BY MOUTH AT BEDTIME 90 tablet 2  . SUMAtriptan (IMITREX) 20 MG/ACT nasal spray Place 1 spray (20 mg total) into the nose every 2 (two) hours as needed. 2 Inhaler 11  . topiramate (TOPAMAX) 100 MG tablet TAKE 1 TABLET BY MOUTH DAILY 90 tablet 2   No current facility-administered medications on file prior to visit.    BP 120/70   Pulse 82   Ht 5\' 8"  (1.727 m)   Wt 165 lb 6 oz (75 kg)   SpO2 94%   BMI 25.15 kg/m       Objective:   Physical Exam Vitals and nursing note reviewed.  Constitutional:      General: He is not in acute distress.    Appearance: Normal appearance. He is well-developed and normal weight.  HENT:     Head: Normocephalic and atraumatic.     Right Ear: Tympanic membrane, ear canal and external ear normal. There is no impacted cerumen.     Left Ear: Tympanic membrane, ear canal and external ear normal. There is no impacted cerumen.     Nose: Nose normal. No congestion or rhinorrhea.     Mouth/Throat:     Mouth: Mucous membranes are moist.     Pharynx: Oropharynx is clear. No oropharyngeal exudate or posterior oropharyngeal erythema.  Eyes:     General:        Right eye: No discharge.        Left eye: No discharge.      Extraocular Movements: Extraocular movements intact.     Conjunctiva/sclera: Conjunctivae normal.     Pupils: Pupils are equal, round, and reactive to light.  Neck:     Vascular: No carotid bruit.     Trachea: No tracheal deviation.  Cardiovascular:     Rate and Rhythm: Normal rate and regular rhythm.     Pulses: Normal pulses.     Heart sounds: Normal heart sounds. No murmur heard. No friction rub. No gallop.   Pulmonary:     Effort:  Pulmonary effort is normal. No respiratory distress.     Breath sounds: Normal breath sounds. No stridor. No wheezing, rhonchi or rales.  Chest:     Chest wall: No tenderness.  Abdominal:     General: Bowel sounds are normal. There is no distension.     Palpations: Abdomen is soft. There is no mass.     Tenderness: There is no abdominal tenderness. There is no right CVA tenderness, left CVA tenderness, guarding or rebound.     Hernia: No hernia is present.  Musculoskeletal:        General: No swelling, tenderness, deformity or signs of injury. Normal range of motion.     Right lower leg: No edema.     Left lower leg: No edema.  Lymphadenopathy:     Cervical: No cervical adenopathy.  Skin:    General: Skin is warm and dry.     Capillary Refill: Capillary refill takes less than 2 seconds.     Coloration: Skin is not jaundiced or pale.     Findings: No bruising, erythema, lesion or rash.  Neurological:     General: No focal deficit present.     Mental Status: He is alert and oriented to person, place, and time.     Cranial Nerves: No cranial nerve deficit.     Sensory: No sensory deficit.     Motor: No weakness.     Coordination: Coordination normal.     Gait: Gait normal.     Deep Tendon Reflexes: Reflexes normal.  Psychiatric:        Mood and Affect: Mood normal.        Behavior: Behavior normal.        Thought Content: Thought content normal.        Judgment: Judgment normal.       Assessment & Plan:  1. Routine general medical  examination at a health care facility - Continue to eat healthy and exercise - Recommend flu and covid vaccination  - Follow up in one year or sooner if needed - CBC with Differential/Platelet - Comprehensive metabolic panel - Lipid panel - TSH  2. Mixed hyperlipidemia -Consider increase in Zocor  - CBC with Differential/Platelet - Comprehensive metabolic panel - Lipid panel - TSH  3. History of kidney stones - Continue with Chlorthalidone and Urocit-K - CBC with Differential/Platelet - Comprehensive metabolic panel - Lipid panel - TSH  4. Prostate cancer screening - PSA  5. Erectile dysfunction, unspecified erectile dysfunction type  - CBC with Differential/Platelet - Comprehensive metabolic panel - Lipid panel - TSH - sildenafil (REVATIO) 20 MG tablet; Take 1 tablet (20 mg total) by mouth at bedtime as needed.  Dispense: 20 tablet; Refill: 11  6. Bipolar disorder, in full remission, most recent episode mixed (HCC) - Continue with Lamictal   7. Hx of migraine headaches - Continue Topamax and imitrex PRN   8. Need for shingles vaccine  - Varicella-zoster vaccine subcutaneous  Shirline Frees, NP

## 2021-05-16 ENCOUNTER — Other Ambulatory Visit: Payer: Self-pay | Admitting: Adult Health

## 2021-05-16 DIAGNOSIS — Z87442 Personal history of urinary calculi: Secondary | ICD-10-CM

## 2021-05-18 ENCOUNTER — Other Ambulatory Visit: Payer: Self-pay | Admitting: Adult Health

## 2021-05-18 DIAGNOSIS — E7841 Elevated Lipoprotein(a): Secondary | ICD-10-CM

## 2021-09-05 DIAGNOSIS — N200 Calculus of kidney: Secondary | ICD-10-CM | POA: Diagnosis not present

## 2021-09-05 DIAGNOSIS — H9190 Unspecified hearing loss, unspecified ear: Secondary | ICD-10-CM | POA: Diagnosis not present

## 2021-09-05 DIAGNOSIS — E782 Mixed hyperlipidemia: Secondary | ICD-10-CM | POA: Diagnosis not present

## 2021-10-10 DIAGNOSIS — H903 Sensorineural hearing loss, bilateral: Secondary | ICD-10-CM | POA: Diagnosis not present

## 2021-10-10 DIAGNOSIS — H9313 Tinnitus, bilateral: Secondary | ICD-10-CM | POA: Diagnosis not present

## 2021-11-07 DIAGNOSIS — H9313 Tinnitus, bilateral: Secondary | ICD-10-CM | POA: Diagnosis not present

## 2021-11-07 DIAGNOSIS — H903 Sensorineural hearing loss, bilateral: Secondary | ICD-10-CM | POA: Diagnosis not present

## 2021-11-07 DIAGNOSIS — Z461 Encounter for fitting and adjustment of hearing aid: Secondary | ICD-10-CM | POA: Diagnosis not present

## 2021-11-11 ENCOUNTER — Other Ambulatory Visit: Payer: Self-pay | Admitting: Adult Health

## 2021-11-14 DIAGNOSIS — Z20822 Contact with and (suspected) exposure to covid-19: Secondary | ICD-10-CM | POA: Diagnosis not present

## 2021-11-25 ENCOUNTER — Encounter: Payer: Self-pay | Admitting: Adult Health

## 2021-11-25 ENCOUNTER — Ambulatory Visit (INDEPENDENT_AMBULATORY_CARE_PROVIDER_SITE_OTHER): Payer: BC Managed Care – PPO | Admitting: Adult Health

## 2021-11-25 VITALS — BP 112/82 | HR 65 | Temp 98.1°F | Ht 67.75 in | Wt 155.0 lb

## 2021-11-25 DIAGNOSIS — Z87442 Personal history of urinary calculi: Secondary | ICD-10-CM

## 2021-11-25 DIAGNOSIS — N529 Male erectile dysfunction, unspecified: Secondary | ICD-10-CM

## 2021-11-25 DIAGNOSIS — Z125 Encounter for screening for malignant neoplasm of prostate: Secondary | ICD-10-CM | POA: Diagnosis not present

## 2021-11-25 DIAGNOSIS — Z8669 Personal history of other diseases of the nervous system and sense organs: Secondary | ICD-10-CM

## 2021-11-25 DIAGNOSIS — F3178 Bipolar disorder, in full remission, most recent episode mixed: Secondary | ICD-10-CM

## 2021-11-25 DIAGNOSIS — E782 Mixed hyperlipidemia: Secondary | ICD-10-CM | POA: Diagnosis not present

## 2021-11-25 DIAGNOSIS — Z0000 Encounter for general adult medical examination without abnormal findings: Secondary | ICD-10-CM

## 2021-11-25 LAB — CBC WITH DIFFERENTIAL/PLATELET
Basophils Absolute: 0 10*3/uL (ref 0.0–0.1)
Basophils Relative: 0.6 % (ref 0.0–3.0)
Eosinophils Absolute: 0.2 10*3/uL (ref 0.0–0.7)
Eosinophils Relative: 2.6 % (ref 0.0–5.0)
HCT: 47.1 % (ref 39.0–52.0)
Hemoglobin: 16.1 g/dL (ref 13.0–17.0)
Lymphocytes Relative: 15.9 % (ref 12.0–46.0)
Lymphs Abs: 1.1 10*3/uL (ref 0.7–4.0)
MCHC: 34.1 g/dL (ref 30.0–36.0)
MCV: 89.7 fl (ref 78.0–100.0)
Monocytes Absolute: 0.6 10*3/uL (ref 0.1–1.0)
Monocytes Relative: 7.9 % (ref 3.0–12.0)
Neutro Abs: 5.2 10*3/uL (ref 1.4–7.7)
Neutrophils Relative %: 73 % (ref 43.0–77.0)
Platelets: 236 10*3/uL (ref 150.0–400.0)
RBC: 5.25 Mil/uL (ref 4.22–5.81)
RDW: 13.4 % (ref 11.5–15.5)
WBC: 7.2 10*3/uL (ref 4.0–10.5)

## 2021-11-25 LAB — COMPREHENSIVE METABOLIC PANEL
ALT: 20 U/L (ref 0–53)
AST: 19 U/L (ref 0–37)
Albumin: 4.3 g/dL (ref 3.5–5.2)
Alkaline Phosphatase: 69 U/L (ref 39–117)
BUN: 18 mg/dL (ref 6–23)
CO2: 30 mEq/L (ref 19–32)
Calcium: 9.5 mg/dL (ref 8.4–10.5)
Chloride: 100 mEq/L (ref 96–112)
Creatinine, Ser: 1 mg/dL (ref 0.40–1.50)
GFR: 82.77 mL/min (ref >60.00)
Glucose, Bld: 117 mg/dL — ABNORMAL HIGH (ref 70–99)
Potassium: 3.8 mEq/L (ref 3.5–5.1)
Sodium: 140 mEq/L (ref 135–145)
Total Bilirubin: 0.5 mg/dL (ref 0.2–1.2)
Total Protein: 6.8 g/dL (ref 6.0–8.3)

## 2021-11-25 LAB — LIPID PANEL
Cholesterol: 145 mg/dL (ref 0–200)
HDL: 44.5 mg/dL (ref >39.00)
LDL Cholesterol: 92 mg/dL (ref 0–99)
NonHDL: 100.19
Total CHOL/HDL Ratio: 3
Triglycerides: 40 mg/dL (ref 0.0–149.0)
VLDL: 8 mg/dL (ref 0.0–40.0)

## 2021-11-25 LAB — TSH: TSH: 1.62 u[IU]/mL (ref 0.35–5.50)

## 2021-11-25 LAB — PSA: PSA: 0.7 ng/mL (ref 0.10–4.00)

## 2021-11-25 NOTE — Progress Notes (Signed)
Subjective:    Patient ID: Jimmy Holmes, male    DOB: 19-Oct-1961, 61 y.o.   MRN: [id number]  HPI Patient presents for yearly preventative medicine examination. He is a pleasant 61 year old male who  has a past medical history of ADHD (attention deficit hyperactivity disorder), Allergy, Asthma, Chronic kidney disease, ED (erectile dysfunction), Hyperlipidemia, and Migraine.  He is also seen routinely at the [facility]   Hyperlipidemia-takes Zocor 10 mg daily. He denies myalgia or fatigue Lab Results  Component Value Date   CHOL 163 11/21/2020   HDL 49.60 11/21/2020   LDLCALC 105 (H) 11/21/2020   TRIG 41.0 11/21/2020   CHOLHDL 3 11/21/2020   Bipolar depression-is controlled with Lamictal 100 mg daily.  He denies depressive symptoms or suicidal ideation  History of kidney stones-managed with chlorthalidone 25 mg daily and Urocit-K.  Denies recent kidney stones.  Migraine headaches-prescribed Topamax 100 mg daily, has been very well controlled with this medication.  Has not had to use abortive therapy such as Imitrex in multiple years  Erectile dysfunction-uses Viagra as needed  All immunizations and health maintenance protocols were reviewed with the patient and needed orders were placed.  Appropriate screening laboratory values were ordered for the patient including screening of hyperlipidemia, renal function and hepatic function. If indicated by BPH, a PSA was ordered.  Medication reconciliation,  past medical history, social history, problem list and allergies were reviewed in detail with the patient  Goals were established with regard to weight loss, exercise, and  diet in compliance with medications Wt Readings from Last 3 Encounters:  11/25/21 155 lb (70.3 kg)  11/21/20 165 lb 6 oz (75 kg)  09/29/20 153 lb (69.4 kg)   He is up to date on routine colon cancer screening   Denies any acute issues   Review of Systems  Constitutional: Negative.   HENT: Negative.    Eyes:  Negative.   Respiratory: Negative.    Cardiovascular: Negative.   Gastrointestinal: Negative.   Endocrine: Negative.   Genitourinary: Negative.   Musculoskeletal: Negative.   Skin: Negative.   Allergic/Immunologic: Negative.   Neurological: Negative.   Hematological: Negative.   Psychiatric/Behavioral: Negative.    All other systems reviewed and are negative. Past Medical History:  Diagnosis Date   ADHD (attention deficit hyperactivity disorder)    Allergy    Asthma    Chronic kidney disease    ED (erectile dysfunction)    Hyperlipidemia    Migraine     Social History   Socioeconomic History   Marital status: Married    Spouse name: Not on file   Number of children: Not on file   Years of education: Not on file   Highest education level: Not on file  Occupational History    Employer: [employer]  Tobacco Use   Smoking status: Never   Smokeless tobacco: Never  Vaping Use   Vaping Use: Never used  Substance and Sexual Activity   Alcohol use: No   Drug use: No   Sexual activity: Not on file  Other Topics Concern   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   He is married.  No tobacco alcohol or drug use.   He drinks 64 ounces of coffee a day      10/25/2017      Social Determinants of Health   Financial Resource Strain: Not on file  Food Insecurity: Not on file  Transportation Needs: Not on file  Physical Activity: Not on file  Stress: Not on file  Social Connections: Not on file  Intimate Partner Violence: Not on file    Past Surgical History:  Procedure Laterality Date   KIDNEY STONE SURGERY      Family History  Adopted: Yes    No Known Allergies  Current Outpatient Medications on File Prior to Visit  Medication Sig Dispense Refill   albuterol (VENTOLIN HFA) 108 (90 Base) MCG/ACT inhaler Inhale 2 puffs into the lungs every 6 (six) hours as needed (for cough). 1 each 0   cetirizine  (ZYRTEC) 10 MG tablet Take 1 tablet (10 mg total) by mouth daily. 90 tablet 3   chlorthalidone (HYGROTON) 25 MG tablet TAKE 1 TABLET(25 MG) BY MOUTH DAILY 90 tablet 0   lamoTRIgine (LAMICTAL) 100 MG tablet TAKE 1 TABLET BY MOUTH DAILY 90 tablet 2   potassium citrate (UROCIT-K) 10 MEQ (1080 MG) SR tablet TAKE 3 TABLETS BY MOUTH EVERY MORNING AND 2 TABLETS AT BEDTIME 450 tablet 2   sildenafil (REVATIO) 20 MG tablet Take 1 tablet (20 mg total) by mouth at bedtime as needed. 20 tablet 11   simvastatin (ZOCOR) 10 MG tablet TAKE 1 TABLET(10 MG) BY MOUTH AT BEDTIME 90 tablet 2   SUMAtriptan (IMITREX) 20 MG/ACT nasal spray Place 1 spray (20 mg total) into the nose every 2 (two) hours as needed. 2 Inhaler 11   topiramate (TOPAMAX) 100 MG tablet TAKE 1 TABLET BY MOUTH DAILY 90 tablet 2   No current facility-administered medications on file prior to visit.    BP 112/82    Pulse 65    Temp 98.1 F (36.7 C) (Oral)    Ht 5' 7.75" (1.721 m)    Wt 155 lb (70.3 kg)    SpO2 96%    BMI 23.74 kg/m       Objective:   Physical Exam Vitals and nursing note reviewed.  Constitutional:      General: He is not in acute distress.    Appearance: Normal appearance. He is well-developed and normal weight.  HENT:     Head: Normocephalic and atraumatic.     Right Ear: Tympanic membrane, ear canal and external ear normal. There is no impacted cerumen.     Left Ear: Tympanic membrane, ear canal and external ear normal. There is no impacted cerumen.     Nose: Nose normal. No congestion or rhinorrhea.     Mouth/Throat:     Mouth: Mucous membranes are moist.     Pharynx: Oropharynx is clear. No oropharyngeal exudate or posterior oropharyngeal erythema.  Eyes:     General:        Right eye: No discharge.        Left eye: No discharge.     Extraocular Movements: Extraocular movements intact.     Conjunctiva/sclera: Conjunctivae normal.     Pupils: Pupils are equal, round, and reactive to light.  Neck:     Vascular:  No carotid bruit.     Trachea: No tracheal deviation.  Cardiovascular:     Rate and Rhythm: Normal rate and regular rhythm.     Pulses: Normal pulses.     Heart sounds: Normal heart sounds. No murmur heard.   No friction rub. No gallop.  Pulmonary:     Effort: Pulmonary effort is normal. No respiratory distress.     Breath sounds: Normal breath sounds. No stridor. No wheezing, rhonchi or rales.  Chest:  Chest wall: No tenderness.  Abdominal:     General: Bowel sounds are normal. There is no distension.     Palpations: Abdomen is soft. There is no mass.     Tenderness: There is no abdominal tenderness. There is no right CVA tenderness, left CVA tenderness, guarding or rebound.     Hernia: No hernia is present.  Musculoskeletal:        General: No swelling, tenderness, deformity or signs of injury. Normal range of motion.     Right lower leg: No edema.     Left lower leg: No edema.  Lymphadenopathy:     Cervical: No cervical adenopathy.  Skin:    General: Skin is warm and dry.     Capillary Refill: Capillary refill takes less than 2 seconds.     Coloration: Skin is not jaundiced or pale.     Findings: No bruising, erythema, lesion or rash.  Neurological:     General: No focal deficit present.     Mental Status: He is alert and oriented to person, place, and time.     Cranial Nerves: No cranial nerve deficit.     Sensory: No sensory deficit.     Motor: No weakness.     Coordination: Coordination normal.     Gait: Gait normal.     Deep Tendon Reflexes: Reflexes normal.  Psychiatric:        Mood and Affect: Mood normal.        Behavior: Behavior normal.        Thought Content: Thought content normal.        Judgment: Judgment normal.      Assessment & Plan:  1. Routine general medical examination at a health care facility - Continue to stay active and eat health  - Follow up in one year or sooner if needed - CBC with Differential/Platelet; Future - Comprehensive  metabolic panel; Future - Lipid panel; Future - TSH; Future  2. Mixed hyperlipidemia - Consider increase in statin  - CBC with Differential/Platelet; Future - Comprehensive metabolic panel; Future - Lipid panel; Future - TSH; Future  3. History of kidney stones - Continues chlorthalidone and Potassium  - CBC with Differential/Platelet; Future - Comprehensive metabolic panel; Future - Lipid panel; Future - TSH; Future  4. Prostate cancer screening  - PSA; Future  5. Erectile dysfunction, unspecified erectile dysfunction type - Continue with Viagra PRN   6. Bipolar disorder, in full remission, most recent episode mixed (HCC) - Continue with Lamictal - controlled.   7. Hx of migraine headaches - Continue with Topamax 100 mg daily  - Controlled.   Shirline Freesory Yunuen Mordan, NP

## 2021-11-25 NOTE — Patient Instructions (Signed)
It was great seeing you today   We will follow up with you regarding your lab work   Please let me know if you need anything   

## 2021-11-26 ENCOUNTER — Other Ambulatory Visit: Payer: Self-pay | Admitting: Adult Health

## 2021-11-26 DIAGNOSIS — N529 Male erectile dysfunction, unspecified: Secondary | ICD-10-CM

## 2021-11-26 DIAGNOSIS — Z87442 Personal history of urinary calculi: Secondary | ICD-10-CM

## 2021-11-26 DIAGNOSIS — Z820 Family history of epilepsy and other diseases of the nervous system: Secondary | ICD-10-CM

## 2021-11-26 DIAGNOSIS — E7841 Elevated Lipoprotein(a): Secondary | ICD-10-CM

## 2021-11-26 MED ORDER — SIMVASTATIN 10 MG PO TABS: ORAL_TABLET | ORAL | 3 refills | Status: DC

## 2021-11-26 MED ORDER — SILDENAFIL CITRATE 20 MG PO TABS: 20.0000 mg | ORAL_TABLET | Freq: Every evening | ORAL | 11 refills | Status: DC | PRN

## 2021-11-26 MED ORDER — LAMOTRIGINE 100 MG PO TABS: 100.0000 mg | ORAL_TABLET | Freq: Every day | ORAL | 3 refills | Status: DC

## 2021-11-26 MED ORDER — POTASSIUM CITRATE ER 10 MEQ (1080 MG) PO TBCR: EXTENDED_RELEASE_TABLET | ORAL | 3 refills | Status: AC

## 2021-11-26 MED ORDER — TOPIRAMATE 100 MG PO TABS: 100.0000 mg | ORAL_TABLET | Freq: Every day | ORAL | 3 refills | Status: DC

## 2021-11-26 MED ORDER — CHLORTHALIDONE 25 MG PO TABS: ORAL_TABLET | ORAL | 3 refills | Status: DC

## 2022-02-22 ENCOUNTER — Other Ambulatory Visit: Payer: Self-pay | Admitting: Adult Health

## 2022-02-22 DIAGNOSIS — E7841 Elevated Lipoprotein(a): Secondary | ICD-10-CM

## 2022-02-27 DIAGNOSIS — N200 Calculus of kidney: Secondary | ICD-10-CM | POA: Diagnosis not present

## 2022-02-27 DIAGNOSIS — N529 Male erectile dysfunction, unspecified: Secondary | ICD-10-CM | POA: Diagnosis not present

## 2022-02-27 DIAGNOSIS — E785 Hyperlipidemia, unspecified: Secondary | ICD-10-CM | POA: Diagnosis not present

## 2022-07-24 ENCOUNTER — Ambulatory Visit: Payer: BC Managed Care – PPO | Admitting: Adult Health

## 2022-07-24 ENCOUNTER — Encounter: Payer: Self-pay | Admitting: Adult Health

## 2022-07-24 VITALS — BP 130/80 | HR 56 | Temp 99.0°F | Ht 67.75 in | Wt 160.0 lb

## 2022-07-24 DIAGNOSIS — K2100 Gastro-esophageal reflux disease with esophagitis, without bleeding: Secondary | ICD-10-CM

## 2022-07-24 MED ORDER — PANTOPRAZOLE SODIUM 40 MG PO TBEC: 40.0000 mg | DELAYED_RELEASE_TABLET | Freq: Every day | ORAL | 0 refills | Status: DC

## 2022-07-24 NOTE — Progress Notes (Signed)
Subjective:    Patient ID: Jimmy Holmes, male    DOB: October 02, 1961, 61 y.o.   MRN: [id number]  HPI 61 year old male who  has a past medical history of ADHD (attention deficit hyperactivity disorder), Allergy, Asthma, Chronic kidney disease, ED (erectile dysfunction), Hyperlipidemia, and Migraine.  He presents to the office today for an acute issue of abdominal discomfort over the last six weeks. Discomfort is below the sternum. He reports bloating and loose stools. Has nausea in the evening after he takes his medications but no vomiting. He has not noticed any blood in his stool. He does not drink and does not smoke. He does smoke a lot of pork and eats it throughout the week.   Over the last week he has noticed that his pills are coming out looking like they were not digested.    Review of Systems See HPI   Past Medical History:  Diagnosis Date   ADHD (attention deficit hyperactivity disorder)    Allergy    Asthma    Chronic kidney disease    ED (erectile dysfunction)    Hyperlipidemia    Migraine     Social History   Socioeconomic History   Marital status: Married    Spouse name: Not on file   Number of children: Not on file   Years of education: Not on file   Highest education level: Some college, no degree  Occupational History    Employer: [employer]  Tobacco Use   Smoking status: Never   Smokeless tobacco: Never  Vaping Use   Vaping Use: Never used  Substance and Sexual Activity   Alcohol use: No   Drug use: No   Sexual activity: Not on file  Other Topics Concern   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   He is married.  No tobacco alcohol or drug use.   He drinks 64 ounces of coffee a day      10/25/2017      Social Determinants of Health   Financial Resource Strain: Low Risk  (07/21/2022)   Overall Financial Resource Strain (CARDIA)    Difficulty of Paying Living Expenses: Not hard at  all  Food Insecurity: No Food Insecurity (07/21/2022)   Hunger Vital Sign    Worried About Running Out of Food in the Last Year: Never true    Ran Out of Food in the Last Year: Never true  Transportation Needs: No Transportation Needs (07/21/2022)   PRAPARE - Administrator, Civil Service (Medical): No    Lack of Transportation (Non-Medical): No  Physical Activity: Sufficiently Active (07/21/2022)   Exercise Vital Sign    Days of Exercise per Week: 5 days    Minutes of Exercise per Session: 150+ min  Stress: No Stress Concern Present (07/21/2022)   [employer] of Occupational Health - Occupational Stress Questionnaire    Feeling of Stress : Not at all  Social Connections: Socially Integrated (07/21/2022)   Social Connection and Isolation Panel [NHANES]    Frequency of Communication with Friends and Family: More than three times a week    Frequency of Social Gatherings with Friends and Family: More than three times a week    Attends Religious Services: More than 4 times per year    Active Member of [employer] or Organizations: Yes    Attends Banker Meetings: More than 4 times per year  Marital Status: Married  Catering manager Violence: Not on file    Past Surgical History:  Procedure Laterality Date   KIDNEY STONE SURGERY      Family History  Adopted: Yes    No Known Allergies  Current Outpatient Medications on File Prior to Visit  Medication Sig Dispense Refill   albuterol (VENTOLIN HFA) 108 (90 Base) MCG/ACT inhaler Inhale 2 puffs into the lungs every 6 (six) hours as needed (for cough). 1 each 0   aspirin 81 MG chewable tablet CHEW ONE TABLET BY MOUTH DAILY FOR HEART     cetirizine (ZYRTEC) 10 MG tablet Take 1 tablet (10 mg total) by mouth daily. 90 tablet 3   chlorthalidone (HYGROTON) 25 MG tablet TAKE 1 TABLET(25 MG) BY MOUTH DAILY 90 tablet 3   lamoTRIgine (LAMICTAL) 100 MG tablet TAKE 1 TABLET BY MOUTH DAILY 90 tablet 3   potassium citrate  (UROCIT-K) 10 MEQ (1080 MG) SR tablet TAKE 3 TABLETS BY MOUTH EVERY MORNING AND 2 TABLETS AT BEDTIME 450 tablet 3   sildenafil (REVATIO) 20 MG tablet Take 1 tablet (20 mg total) by mouth at bedtime as needed. 20 tablet 11   simvastatin (ZOCOR) 10 MG tablet TAKE 1 TABLET(10 MG) BY MOUTH AT BEDTIME 90 tablet 3   SUMAtriptan (IMITREX) 20 MG/ACT nasal spray Place 1 spray (20 mg total) into the nose every 2 (two) hours as needed. 2 Inhaler 11   topiramate (TOPAMAX) 100 MG tablet Take 1 tablet (100 mg total) by mouth daily. 90 tablet 3   No current facility-administered medications on file prior to visit.    BP 130/80   Pulse (!) 56   Temp 99 F (37.2 C) (Oral)   Ht 5' 7.75" (1.721 m)   Wt 160 lb (72.6 kg)   SpO2 97%   BMI 24.51 kg/m       Objective:   Physical Exam Vitals and nursing note reviewed.  Constitutional:      Appearance: Normal appearance.  Cardiovascular:     Rate and Rhythm: Normal rate and regular rhythm.     Pulses: Normal pulses.     Heart sounds: Normal heart sounds.  Pulmonary:     Effort: Pulmonary effort is normal.     Breath sounds: Normal breath sounds.  Abdominal:     General: Abdomen is flat. Bowel sounds are normal. There is no distension.     Palpations: Abdomen is soft. There is no mass.     Tenderness: There is abdominal tenderness in the epigastric area. Positive signs include Rovsing's sign (.diagmed).     Hernia: No hernia is present.  Musculoskeletal:        General: Normal range of motion.  Skin:    General: Skin is warm and dry.  Neurological:     General: No focal deficit present.     Mental Status: He is alert and oriented to person, place, and time.  Psychiatric:        Mood and Affect: Mood normal.        Behavior: Behavior normal.        Thought Content: Thought content normal.        Assessment & Plan:  1. Gastroesophageal reflux disease with esophagitis without hemorrhage GERD vs peptic ulcer  - Will trial him on protonix for  30 days. If symptoms still present follow up in the office.  - Cut back on pork  - pantoprazole (PROTONIX) 40 MG tablet; Take 1 tablet (40 mg total) by  mouth daily.  Dispense: 30 tablet; Refill: 0

## 2022-08-28 DIAGNOSIS — N200 Calculus of kidney: Secondary | ICD-10-CM | POA: Diagnosis not present

## 2022-08-28 DIAGNOSIS — G43909 Migraine, unspecified, not intractable, without status migrainosus: Secondary | ICD-10-CM | POA: Diagnosis not present

## 2022-08-28 DIAGNOSIS — K219 Gastro-esophageal reflux disease without esophagitis: Secondary | ICD-10-CM | POA: Diagnosis not present

## 2022-08-28 DIAGNOSIS — E785 Hyperlipidemia, unspecified: Secondary | ICD-10-CM | POA: Diagnosis not present

## 2022-09-07 DIAGNOSIS — F902 Attention-deficit hyperactivity disorder, combined type: Secondary | ICD-10-CM | POA: Diagnosis not present

## 2022-09-21 DIAGNOSIS — R194 Change in bowel habit: Secondary | ICD-10-CM | POA: Diagnosis not present

## 2022-09-21 DIAGNOSIS — Z7189 Other specified counseling: Secondary | ICD-10-CM | POA: Diagnosis not present

## 2022-09-21 DIAGNOSIS — K219 Gastro-esophageal reflux disease without esophagitis: Secondary | ICD-10-CM | POA: Diagnosis not present

## 2022-09-21 DIAGNOSIS — Z01818 Encounter for other preprocedural examination: Secondary | ICD-10-CM | POA: Diagnosis not present

## 2022-09-21 DIAGNOSIS — R197 Diarrhea, unspecified: Secondary | ICD-10-CM | POA: Diagnosis not present

## 2022-09-30 DIAGNOSIS — R197 Diarrhea, unspecified: Secondary | ICD-10-CM | POA: Diagnosis not present

## 2022-10-12 DIAGNOSIS — Z0189 Encounter for other specified special examinations: Secondary | ICD-10-CM | POA: Diagnosis not present

## 2022-10-19 DIAGNOSIS — K2951 Unspecified chronic gastritis with bleeding: Secondary | ICD-10-CM | POA: Diagnosis not present

## 2022-10-19 DIAGNOSIS — B9681 Helicobacter pylori [H. pylori] as the cause of diseases classified elsewhere: Secondary | ICD-10-CM | POA: Diagnosis not present

## 2022-10-19 DIAGNOSIS — K2100 Gastro-esophageal reflux disease with esophagitis, without bleeding: Secondary | ICD-10-CM | POA: Diagnosis not present

## 2022-10-19 DIAGNOSIS — R197 Diarrhea, unspecified: Secondary | ICD-10-CM | POA: Diagnosis not present

## 2022-10-19 DIAGNOSIS — K2950 Unspecified chronic gastritis without bleeding: Secondary | ICD-10-CM | POA: Diagnosis not present

## 2022-10-19 DIAGNOSIS — K640 First degree hemorrhoids: Secondary | ICD-10-CM | POA: Diagnosis not present

## 2022-11-02 DIAGNOSIS — F39 Unspecified mood [affective] disorder: Secondary | ICD-10-CM | POA: Diagnosis not present

## 2022-11-02 DIAGNOSIS — F1021 Alcohol dependence, in remission: Secondary | ICD-10-CM | POA: Diagnosis not present

## 2022-11-02 DIAGNOSIS — F17291 Nicotine dependence, other tobacco product, in remission: Secondary | ICD-10-CM | POA: Diagnosis not present

## 2022-11-26 ENCOUNTER — Encounter: Payer: Self-pay | Admitting: Adult Health

## 2022-11-26 ENCOUNTER — Ambulatory Visit (INDEPENDENT_AMBULATORY_CARE_PROVIDER_SITE_OTHER): Payer: BC Managed Care – PPO | Admitting: Adult Health

## 2022-11-26 VITALS — BP 122/86 | HR 66 | Temp 98.1°F | Ht 67.75 in | Wt 157.0 lb

## 2022-11-26 DIAGNOSIS — Z87442 Personal history of urinary calculi: Secondary | ICD-10-CM

## 2022-11-26 DIAGNOSIS — Z125 Encounter for screening for malignant neoplasm of prostate: Secondary | ICD-10-CM | POA: Diagnosis not present

## 2022-11-26 DIAGNOSIS — Z114 Encounter for screening for human immunodeficiency virus [HIV]: Secondary | ICD-10-CM

## 2022-11-26 DIAGNOSIS — Z0000 Encounter for general adult medical examination without abnormal findings: Secondary | ICD-10-CM

## 2022-11-26 DIAGNOSIS — F3178 Bipolar disorder, in full remission, most recent episode mixed: Secondary | ICD-10-CM

## 2022-11-26 DIAGNOSIS — N529 Male erectile dysfunction, unspecified: Secondary | ICD-10-CM

## 2022-11-26 DIAGNOSIS — E782 Mixed hyperlipidemia: Secondary | ICD-10-CM

## 2022-11-26 DIAGNOSIS — Z8669 Personal history of other diseases of the nervous system and sense organs: Secondary | ICD-10-CM

## 2022-11-26 LAB — CBC WITH DIFFERENTIAL/PLATELET
Basophils Absolute: 0.1 10*3/uL (ref 0.0–0.1)
Basophils Relative: 0.9 % (ref 0.0–3.0)
Eosinophils Absolute: 0.2 10*3/uL (ref 0.0–0.7)
Eosinophils Relative: 3.1 % (ref 0.0–5.0)
HCT: 49.3 % (ref 39.0–52.0)
Hemoglobin: 17.2 g/dL — ABNORMAL HIGH (ref 13.0–17.0)
Lymphocytes Relative: 17 % (ref 12.0–46.0)
Lymphs Abs: 1 10*3/uL (ref 0.7–4.0)
MCHC: 34.9 g/dL (ref 30.0–36.0)
MCV: 90.9 fl (ref 78.0–100.0)
Monocytes Absolute: 0.5 10*3/uL (ref 0.1–1.0)
Monocytes Relative: 9.1 % (ref 3.0–12.0)
Neutro Abs: 3.9 10*3/uL (ref 1.4–7.7)
Neutrophils Relative %: 69.9 % (ref 43.0–77.0)
Platelets: 185 10*3/uL (ref 150.0–400.0)
RBC: 5.43 Mil/uL (ref 4.22–5.81)
RDW: 13.3 % (ref 11.5–15.5)
WBC: 5.6 10*3/uL (ref 4.0–10.5)

## 2022-11-26 LAB — LIPID PANEL
Cholesterol: 181 mg/dL (ref 0–200)
HDL: 63.1 mg/dL (ref >39.00)
LDL Cholesterol: 111 mg/dL — ABNORMAL HIGH (ref 0–99)
NonHDL: 118.15
Total CHOL/HDL Ratio: 3
Triglycerides: 37 mg/dL (ref 0.0–149.0)
VLDL: 7.4 mg/dL (ref 0.0–40.0)

## 2022-11-26 LAB — COMPREHENSIVE METABOLIC PANEL
ALT: 31 U/L (ref 0–53)
AST: 26 U/L (ref 0–37)
Albumin: 4.6 g/dL (ref 3.5–5.2)
Alkaline Phosphatase: 58 U/L (ref 39–117)
BUN: 21 mg/dL (ref 6–23)
CO2: 32 mEq/L (ref 19–32)
Calcium: 9.8 mg/dL (ref 8.4–10.5)
Chloride: 99 mEq/L (ref 96–112)
Creatinine, Ser: 1.06 mg/dL (ref 0.40–1.50)
GFR: 76.64 mL/min (ref >60.00)
Glucose, Bld: 112 mg/dL — ABNORMAL HIGH (ref 70–99)
Potassium: 3.5 mEq/L (ref 3.5–5.1)
Sodium: 140 mEq/L (ref 135–145)
Total Bilirubin: 0.7 mg/dL (ref 0.2–1.2)
Total Protein: 6.9 g/dL (ref 6.0–8.3)

## 2022-11-26 LAB — HEMOGLOBIN A1C: Hgb A1c MFr Bld: 5.8 % (ref 4.6–6.5)

## 2022-11-26 LAB — PSA: PSA: 0.49 ng/mL (ref 0.10–4.00)

## 2022-11-26 LAB — TSH: TSH: 1.78 u[IU]/mL (ref 0.35–5.50)

## 2022-11-26 NOTE — Patient Instructions (Signed)
It was great seeing you today   We will follow up with you regarding your lab work   Please let me know if you need anything   

## 2022-11-26 NOTE — Progress Notes (Signed)
Subjective:    Patient ID: Jimmy Holmes, male    DOB: 09-07-1961, 62 y.o.   MRN: [id number]  HPI Patient presents for yearly preventative medicine examination. He is a pleasant 62 year old male who  has a past medical history of ADHD (attention deficit hyperactivity disorder), Allergy, Asthma, Chronic kidney disease, ED (erectile dysfunction), Hyperlipidemia, and Migraine.  He is also seen routinely at the [place]   Hyperlipidemia - managed with Zocor 10 mg daily. He denies myalgia or fatigue  Lab Results  Component Value Date   CHOL 145 11/25/2021   HDL 44.50 11/25/2021   LDLCALC 92 11/25/2021   TRIG 40.0 11/25/2021   CHOLHDL 3 11/25/2021   Bipolar depression -is controlled with Lamictal 100 mg daily.  He denies depressive symptoms or suicidal ideation  History of kidney stones-managed with chlorthalidone 25 mg daily and uro-Craig K.  He denies any recent kidney stones.  Migraine headache-managed with Topamax 100 mg daily, he has been doing very well with this medication has not had to use any abortive therapy in many years  ED - uses viagra PRN   H.Pylori - had endoscopy and colonoscopy done at the [facility] in November 2023 which showed H.pylori. he has finished his abx therapy and is now on 2 months of PPI therapy.   All immunizations and health maintenance protocols were reviewed with the patient and needed orders were placed.  Appropriate screening laboratory values were ordered for the patient including screening of hyperlipidemia, renal function and hepatic function. If indicated by BPH, a PSA was ordered.  Medication reconciliation,  past medical history, social history, problem list and allergies were reviewed in detail with the patient  Goals were established with regard to weight loss, exercise, and  diet in compliance with medications Wt Readings from Last 3 Encounters:  11/26/22 157 lb (71.2 kg)  07/24/22 160 lb (72.6 kg)  11/25/21 155 lb (70.3 kg)   He is up to date  on routine colon cancer screening   Review of Systems  Constitutional: Negative.   HENT: Negative.    Eyes: Negative.   Respiratory: Negative.    Cardiovascular: Negative.   Gastrointestinal: Negative.   Endocrine: Negative.   Genitourinary: Negative.   Musculoskeletal: Negative.   Skin: Negative.   Allergic/Immunologic: Negative.   Neurological: Negative.   Hematological: Negative.   Psychiatric/Behavioral: Negative.    All other systems reviewed and are negative.  Past Medical History:  Diagnosis Date   ADHD (attention deficit hyperactivity disorder)    Allergy    Asthma    Chronic kidney disease    ED (erectile dysfunction)    Hyperlipidemia    Migraine     Social History   Socioeconomic History   Marital status: Married    Spouse name: Not on file   Number of children: Not on file   Years of education: Not on file   Highest education level: Some college, no degree  Occupational History    Employer: [employer]  Tobacco Use   Smoking status: Never   Smokeless tobacco: Never  Vaping Use   Vaping Use: Never used  Substance and Sexual Activity   Alcohol use: No   Drug use: No   Sexual activity: Not on file  Other Topics Concern   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   He is married.  No tobacco alcohol or drug use.   He drinks 64  ounces of coffee a day      10/25/2017      Social Determinants of Health   Financial Resource Strain: Low Risk  (07/21/2022)   Overall Financial Resource Strain (CARDIA)    Difficulty of Paying Living Expenses: Not hard at all  Food Insecurity: No Food Insecurity (07/21/2022)   Hunger Vital Sign    Worried About Running Out of Food in the Last Year: Never true    Ran Out of Food in the Last Year: Never true  Transportation Needs: No Transportation Needs (07/21/2022)   PRAPARE - Hydrologist (Medical): No    Lack of Transportation  (Non-Medical): No  Physical Activity: Sufficiently Active (07/21/2022)   Exercise Vital Sign    Days of Exercise per Week: 5 days    Minutes of Exercise per Session: 150+ min  Stress: No Stress Concern Present (07/21/2022)   Jimmy Holmes    Feeling of Stress : Not at all  Social Connections: [place] (07/21/2022)   Social Connection and Isolation Panel [NHANES]    Frequency of Communication with Friends and Family: More than three times a week    Frequency of Social Gatherings with Friends and Family: More than three times a week    Attends Religious Services: More than 4 times per year    Active Member of [employer] or Organizations: Yes    Attends Music therapist: More than 4 times per year    Marital Status: Married  Human resources officer Violence: Not on file    Past Surgical History:  Procedure Laterality Date   KIDNEY STONE SURGERY      Family History  Adopted: Yes    No Known Allergies  Current Outpatient Medications on File Prior to Visit  Medication Sig Dispense Refill   albuterol (VENTOLIN HFA) 108 (90 Base) MCG/ACT inhaler Inhale 2 puffs into the lungs every 6 (six) hours as needed (for cough). 1 each 0   chlorthalidone (HYGROTON) 25 MG tablet TAKE 1 TABLET(25 MG) BY MOUTH DAILY 90 tablet 3   fexofenadine (ALLEGRA ALLERGY) 180 MG tablet      lamoTRIgine (LAMICTAL) 100 MG tablet TAKE 1 TABLET BY MOUTH DAILY 90 tablet 3   pantoprazole (PROTONIX) 40 MG tablet Take 1 tablet (40 mg total) by mouth daily. 30 tablet 0   potassium citrate (UROCIT-K) 10 MEQ (1080 MG) SR tablet TAKE 3 TABLETS BY MOUTH EVERY MORNING AND 2 TABLETS AT BEDTIME 450 tablet 3   sildenafil (REVATIO) 20 MG tablet Take 1 tablet (20 mg total) by mouth at bedtime as needed. 20 tablet 11   simvastatin (ZOCOR) 10 MG tablet TAKE 1 TABLET(10 MG) BY MOUTH AT BEDTIME 90 tablet 3   SUMAtriptan (IMITREX) 20 MG/ACT nasal spray Place 1  spray (20 mg total) into the nose every 2 (two) hours as needed. 2 Inhaler 11   topiramate (TOPAMAX) 100 MG tablet Take 1 tablet (100 mg total) by mouth daily. 90 tablet 3   No current facility-administered medications on file prior to visit.    Temp 98.1 F (36.7 C) (Oral)   Ht 5' 7.75" (1.721 m)   Wt 157 lb (71.2 kg)   BMI 24.05 kg/m       Objective:   Physical Exam Vitals and nursing note reviewed.  Constitutional:      General: He is not in acute distress.    Appearance: Normal appearance. He is well-developed and normal weight.  HENT:     Head: Normocephalic and atraumatic.     Right Ear: Tympanic membrane, ear canal and external ear normal. There is no impacted cerumen.     Left Ear: Tympanic membrane, ear canal and external ear normal. There is no impacted cerumen.     Nose: Nose normal. No congestion or rhinorrhea.     Mouth/Throat:     Mouth: Mucous membranes are moist.     Pharynx: Oropharynx is clear. No oropharyngeal exudate or posterior oropharyngeal erythema.  Eyes:     General:        Right eye: No discharge.        Left eye: No discharge.     Extraocular Movements: Extraocular movements intact.     Conjunctiva/sclera: Conjunctivae normal.     Pupils: Pupils are equal, round, and reactive to light.  Neck:     Vascular: No carotid bruit.     Trachea: No tracheal deviation.  Cardiovascular:     Rate and Rhythm: Normal rate and regular rhythm.     Pulses: Normal pulses.     Heart sounds: Normal heart sounds. No murmur heard.    No friction rub. No gallop.  Pulmonary:     Effort: Pulmonary effort is normal. No respiratory distress.     Breath sounds: Normal breath sounds. No stridor. No wheezing, rhonchi or rales.  Chest:     Chest wall: No tenderness.  Abdominal:     General: Bowel sounds are normal. There is no distension.     Palpations: Abdomen is soft. There is no mass.     Tenderness: There is no abdominal tenderness. There is no right CVA  tenderness, left CVA tenderness, guarding or rebound.     Hernia: No hernia is present.  Musculoskeletal:        General: No swelling, tenderness, deformity or signs of injury. Normal range of motion.     Right lower leg: No edema.     Left lower leg: No edema.  Lymphadenopathy:     Cervical: No cervical adenopathy.  Skin:    General: Skin is warm and dry.     Capillary Refill: Capillary refill takes less than 2 seconds.     Coloration: Skin is not jaundiced or pale.     Findings: No bruising, erythema, lesion or rash.  Neurological:     General: No focal deficit present.     Mental Status: He is alert and oriented to person, place, and time.     Cranial Nerves: No cranial nerve deficit.     Sensory: No sensory deficit.     Motor: No weakness.     Coordination: Coordination normal.     Gait: Gait normal.     Deep Tendon Reflexes: Reflexes normal.  Psychiatric:        Mood and Affect: Mood normal.        Behavior: Behavior normal.        Thought Content: Thought content normal.        Judgment: Judgment normal.       Assessment & Plan:  1. Routine general medical examination at a health care facility Today patient counseled on age appropriate routine health concerns for screening and prevention, each reviewed and up to date or declined. Immunizations reviewed and up to date or declined. Labs ordered and reviewed. Risk factors for depression reviewed and negative. Hearing function and visual acuity are intact. ADLs screened and addressed as needed. Functional ability and level of safety reviewed  and appropriate. Education, counseling and referrals performed based on assessed risks today. Patient provided with a copy of personalized plan for preventive services.    2. Mixed hyperlipidemia - he would like to come off statin, advised to continue. We discussed 3-6 month vacation.  - CBC with Differential/Platelet; Future - Comprehensive metabolic panel; Future - Lipid panel;  Future - TSH; Future - Hemoglobin A1c; Future  3. Bipolar disorder, in full remission, most recent episode mixed ([place]) - Continue with lamictal  - CBC with Differential/Platelet; Future - Comprehensive metabolic panel; Future - Lipid panel; Future - TSH; Future - Hemoglobin A1c; Future  4. History of kidney stones - No recent kidney stones - CBC with Differential/Platelet; Future - Comprehensive metabolic panel; Future - Lipid panel; Future - TSH; Future - Hemoglobin A1c; Future  5. Hx of migraine headaches - well controlled. No change in medications  - CBC with Differential/Platelet; Future - Comprehensive metabolic panel; Future - Lipid panel; Future - TSH; Future - Hemoglobin A1c; Future  6. Erectile dysfunction, unspecified erectile dysfunction type  - CBC with Differential/Platelet; Future - Comprehensive metabolic panel; Future - Lipid panel; Future - TSH; Future - Hemoglobin A1c; Future  7. Encounter for screening for HIV  - HIV Antibody (routine testing w rflx); Future  8. Prostate cancer screening  - PSA; Future  Dorothyann Peng, NP

## 2022-11-28 LAB — HIV ANTIBODY (ROUTINE TESTING W REFLEX): HIV 1&2 Ab, 4th Generation: NONREACTIVE

## 2022-11-28 LAB — LAMOTRIGINE LEVEL: Lamotrigine Lvl: 1.7 ug/mL — ABNORMAL LOW (ref 2.5–15.0)

## 2022-12-03 ENCOUNTER — Encounter: Payer: Self-pay | Admitting: Adult Health

## 2022-12-04 ENCOUNTER — Other Ambulatory Visit: Payer: Self-pay | Admitting: Adult Health

## 2022-12-06 ENCOUNTER — Encounter: Payer: Self-pay | Admitting: Adult Health

## 2022-12-07 MED ORDER — CHLORTHALIDONE 25 MG PO TABS: ORAL_TABLET | ORAL | 3 refills | Status: DC

## 2022-12-21 DIAGNOSIS — B9681 Helicobacter pylori [H. pylori] as the cause of diseases classified elsewhere: Secondary | ICD-10-CM | POA: Diagnosis not present

## 2022-12-21 DIAGNOSIS — R197 Diarrhea, unspecified: Secondary | ICD-10-CM | POA: Diagnosis not present

## 2023-01-22 DIAGNOSIS — B9681 Helicobacter pylori [H. pylori] as the cause of diseases classified elsewhere: Secondary | ICD-10-CM | POA: Diagnosis not present

## 2023-02-22 DIAGNOSIS — F39 Unspecified mood [affective] disorder: Secondary | ICD-10-CM | POA: Diagnosis not present

## 2023-02-22 DIAGNOSIS — F1021 Alcohol dependence, in remission: Secondary | ICD-10-CM | POA: Diagnosis not present

## 2023-02-22 DIAGNOSIS — F17201 Nicotine dependence, unspecified, in remission: Secondary | ICD-10-CM | POA: Diagnosis not present

## 2023-04-04 ENCOUNTER — Encounter: Payer: Self-pay | Admitting: Adult Health

## 2023-04-07 ENCOUNTER — Other Ambulatory Visit: Payer: Self-pay | Admitting: Adult Health

## 2023-04-08 MED ORDER — TOPIRAMATE 100 MG PO TABS: 100.0000 mg | ORAL_TABLET | Freq: Every day | ORAL | 3 refills | Status: DC

## 2023-04-08 MED ORDER — LAMOTRIGINE 100 MG PO TABS: 100.0000 mg | ORAL_TABLET | Freq: Every day | ORAL | 0 refills | Status: DC

## 2023-07-09 ENCOUNTER — Other Ambulatory Visit: Payer: Self-pay | Admitting: Adult Health

## 2023-08-23 DIAGNOSIS — N529 Male erectile dysfunction, unspecified: Secondary | ICD-10-CM | POA: Diagnosis not present

## 2023-08-23 DIAGNOSIS — G43909 Migraine, unspecified, not intractable, without status migrainosus: Secondary | ICD-10-CM | POA: Diagnosis not present

## 2023-08-23 DIAGNOSIS — E785 Hyperlipidemia, unspecified: Secondary | ICD-10-CM | POA: Diagnosis not present

## 2023-08-23 DIAGNOSIS — Z87442 Personal history of urinary calculi: Secondary | ICD-10-CM | POA: Diagnosis not present

## 2023-10-12 ENCOUNTER — Other Ambulatory Visit: Payer: Self-pay | Admitting: Adult Health

## 2023-10-26 DIAGNOSIS — K219 Gastro-esophageal reflux disease without esophagitis: Secondary | ICD-10-CM | POA: Diagnosis not present

## 2023-11-19 DIAGNOSIS — E785 Hyperlipidemia, unspecified: Secondary | ICD-10-CM | POA: Diagnosis not present

## 2023-12-20 DIAGNOSIS — F39 Unspecified mood [affective] disorder: Secondary | ICD-10-CM | POA: Diagnosis not present

## 2023-12-20 DIAGNOSIS — F17291 Nicotine dependence, other tobacco product, in remission: Secondary | ICD-10-CM | POA: Diagnosis not present

## 2023-12-20 DIAGNOSIS — F1021 Alcohol dependence, in remission: Secondary | ICD-10-CM | POA: Diagnosis not present

## 2023-12-29 ENCOUNTER — Encounter: Payer: Self-pay | Admitting: Adult Health

## 2023-12-29 ENCOUNTER — Ambulatory Visit (INDEPENDENT_AMBULATORY_CARE_PROVIDER_SITE_OTHER): Payer: BC Managed Care – PPO | Admitting: Adult Health

## 2023-12-29 ENCOUNTER — Ambulatory Visit: Payer: BC Managed Care – PPO

## 2023-12-29 VITALS — BP 100/80 | HR 52 | Temp 98.1°F | Ht 67.15 in | Wt 151.0 lb

## 2023-12-29 DIAGNOSIS — M25562 Pain in left knee: Secondary | ICD-10-CM

## 2023-12-29 DIAGNOSIS — Z87442 Personal history of urinary calculi: Secondary | ICD-10-CM | POA: Diagnosis not present

## 2023-12-29 DIAGNOSIS — M25561 Pain in right knee: Secondary | ICD-10-CM

## 2023-12-29 DIAGNOSIS — E782 Mixed hyperlipidemia: Secondary | ICD-10-CM

## 2023-12-29 DIAGNOSIS — G8929 Other chronic pain: Secondary | ICD-10-CM

## 2023-12-29 DIAGNOSIS — F3178 Bipolar disorder, in full remission, most recent episode mixed: Secondary | ICD-10-CM | POA: Diagnosis not present

## 2023-12-29 DIAGNOSIS — Z0000 Encounter for general adult medical examination without abnormal findings: Secondary | ICD-10-CM

## 2023-12-29 DIAGNOSIS — Z8669 Personal history of other diseases of the nervous system and sense organs: Secondary | ICD-10-CM | POA: Diagnosis not present

## 2023-12-29 DIAGNOSIS — M25461 Effusion, right knee: Secondary | ICD-10-CM | POA: Diagnosis not present

## 2023-12-29 DIAGNOSIS — Z125 Encounter for screening for malignant neoplasm of prostate: Secondary | ICD-10-CM | POA: Diagnosis not present

## 2023-12-29 DIAGNOSIS — M25462 Effusion, left knee: Secondary | ICD-10-CM | POA: Diagnosis not present

## 2023-12-29 DIAGNOSIS — N529 Male erectile dysfunction, unspecified: Secondary | ICD-10-CM

## 2023-12-29 LAB — CBC
HCT: 49.7 % (ref 39.0–52.0)
Hemoglobin: 16.6 g/dL (ref 13.0–17.0)
MCHC: 33.5 g/dL (ref 30.0–36.0)
MCV: 92.7 fL (ref 78.0–100.0)
Platelets: 210 10*3/uL (ref 150.0–400.0)
RBC: 5.36 Mil/uL (ref 4.22–5.81)
RDW: 13.7 % (ref 11.5–15.5)
WBC: 4.5 10*3/uL (ref 4.0–10.5)

## 2023-12-29 LAB — COMPREHENSIVE METABOLIC PANEL
ALT: 27 U/L (ref 0–53)
AST: 30 U/L (ref 0–37)
Albumin: 4.6 g/dL (ref 3.5–5.2)
Alkaline Phosphatase: 70 U/L (ref 39–117)
BUN: 20 mg/dL (ref 6–23)
CO2: 28 meq/L (ref 19–32)
Calcium: 9.7 mg/dL (ref 8.4–10.5)
Chloride: 103 meq/L (ref 96–112)
Creatinine, Ser: 0.9 mg/dL (ref 0.40–1.50)
GFR: 92.56 mL/min (ref >60.00)
Glucose, Bld: 106 mg/dL — ABNORMAL HIGH (ref 70–99)
Potassium: 3.7 meq/L (ref 3.5–5.1)
Sodium: 141 meq/L (ref 135–145)
Total Bilirubin: 0.6 mg/dL (ref 0.2–1.2)
Total Protein: 6.8 g/dL (ref 6.0–8.3)

## 2023-12-29 LAB — LIPID PANEL
Cholesterol: 189 mg/dL (ref 0–200)
HDL: 61.4 mg/dL (ref >39.00)
LDL Cholesterol: 121 mg/dL — ABNORMAL HIGH (ref 0–99)
NonHDL: 127.48
Total CHOL/HDL Ratio: 3
Triglycerides: 32 mg/dL (ref 0.0–149.0)
VLDL: 6.4 mg/dL (ref 0.0–40.0)

## 2023-12-29 LAB — PSA: PSA: 0.74 ng/mL (ref 0.10–4.00)

## 2023-12-29 LAB — TSH: TSH: 1.54 u[IU]/mL (ref 0.35–5.50)

## 2023-12-29 LAB — HEMOGLOBIN A1C: Hgb A1c MFr Bld: 5.9 % (ref 4.6–6.5)

## 2023-12-29 NOTE — Patient Instructions (Signed)
 It was great seeing you today   We will follow up with you regarding your lab work   Please let me know if you need anything

## 2023-12-29 NOTE — Progress Notes (Signed)
 Subjective:    Patient ID: Jimmy Holmes, male    DOB: 11-03-1961, 63 y.o.   MRN: [id number]  HPI Patient presents for yearly preventative medicine examination. He is a pleasant 63 year old male who  has a past medical history of ADHD (attention deficit hyperactivity disorder), Allergy, Asthma, Chronic kidney disease, ED (erectile dysfunction), Hyperlipidemia, and Migraine.  He is also seen routinely at the [facility]   Hyperlipidemia - stopped taking Simvastatin  about a month and a half ago and has been eating healthier.  Lab Results  Component Value Date   CHOL 181 11/26/2022   HDL 63.10 11/26/2022   LDLCALC 111 (H) 11/26/2022   TRIG 37.0 11/26/2022   CHOLHDL 3 11/26/2022   Bipolar depression -is controlled with Lamictal  100 mg daily.  He denies depressive symptoms or suicidal ideation  History of kidney stones-managed with chlorthalidone  25 mg daily and Urocit- K.  He denies any recent kidney stones.  Migraine headache-managed with Topamax  100 mg daily, he has been doing very well with this medication has not had to use any abortive therapy in many years  ED - uses Cialis  20 mg PRN   Chronic Knee Pain - has been going on for quite some time ( years) that have been becoming progressively worse. He has known history of pes planus. He has pain all the time but worse with walking, climbing and crouching.   All immunizations and health maintenance protocols were reviewed with the patient and needed orders were placed.  Appropriate screening laboratory values were ordered for the patient including screening of hyperlipidemia, renal function and hepatic function. If indicated by BPH, a PSA was ordered.  Medication reconciliation,  past medical history, social history, problem list and allergies were reviewed in detail with the patient  Goals were established with regard to weight loss, exercise, and  diet in compliance with medications. He has given up processed foods and has been able  to lose about 10 pounds.  Wt Readings from Last 3 Encounters:  12/29/23 151 lb (68.5 kg)  11/26/22 157 lb (71.2 kg)  07/24/22 160 lb (72.6 kg)   He is up to date on routine colon cancer screening    Review of Systems  Constitutional: Negative.   HENT: Negative.    Eyes: Negative.   Respiratory: Negative.    Cardiovascular: Negative.   Gastrointestinal: Negative.   Endocrine: Negative.   Genitourinary: Negative.   Musculoskeletal:  Positive for arthralgias and back pain.  Skin: Negative.   Allergic/Immunologic: Negative.   Neurological: Negative.   Hematological: Negative.   Psychiatric/Behavioral: Negative.    All other systems reviewed and are negative.  Past Medical History:  Diagnosis Date   ADHD (attention deficit hyperactivity disorder)    Allergy    Asthma    Chronic kidney disease    ED (erectile dysfunction)    Hyperlipidemia    Migraine     Social History   Socioeconomic History   Marital status: Married    Spouse name: Not on file   Number of children: Not on file   Years of education: Not on file   Highest education level: Some college, no degree  Occupational History    Employer: [employer]  Tobacco Use   Smoking status: Never   Smokeless tobacco: Never  Vaping Use   Vaping status: Never Used  Substance and Sexual Activity   Alcohol use: No   Drug use: No   Sexual activity: Not on file  Other Topics Concern  Not on file  Social History Narrative   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   He is married.  No tobacco alcohol or drug use.   He drinks 64 ounces of coffee a day      10/25/2017      Social Drivers of Health   Financial Resource Strain: Low Risk  (12/25/2023)   Overall Financial Resource Strain (CARDIA)    Difficulty of Paying Living Expenses: Not hard at all  Food Insecurity: No Food Insecurity (12/25/2023)   Hunger Vital Sign    Worried About Running Out of Food in the Last Year: Never true    Ran Out  of Food in the Last Year: Never true  Transportation Needs: No Transportation Needs (12/25/2023)   PRAPARE - Administrator, Civil Service (Medical): No    Lack of Transportation (Non-Medical): No  Physical Activity: Sufficiently Active (12/25/2023)   Exercise Vital Sign    Days of Exercise per Week: 5 days    Minutes of Exercise per Session: 120 min  Stress: No Stress Concern Present (12/25/2023)   [employer] of Occupational Health - Occupational Stress Questionnaire    Feeling of Stress : Not at all  Social Connections: Socially Integrated (12/25/2023)   Social Connection and Isolation Panel [NHANES]    Frequency of Communication with Friends and Family: More than three times a week    Frequency of Social Gatherings with Friends and Family: Once a week    Attends Religious Services: More than 4 times per year    Active Member of [employer] or Organizations: Yes    Attends Engineer, Structural: More than 4 times per year    Marital Status: Married  Catering Manager Violence: Not on file    Past Surgical History:  Procedure Laterality Date   KIDNEY STONE SURGERY      Family History  Adopted: Yes    No Known Allergies  Current Outpatient Medications on File Prior to Visit  Medication Sig Dispense Refill   albuterol  (VENTOLIN  HFA) 108 (90 Base) MCG/ACT inhaler Inhale 2 puffs into the lungs every 6 (six) hours as needed (for cough). 1 each 0   Ascorbic Acid (VITAMIN C) 1000 MG tablet Take 1,500 mg by mouth daily.     chlorthalidone  (HYGROTON ) 25 MG tablet TAKE 1 TABLET(25 MG) BY MOUTH DAILY 90 tablet 3   Cholecalciferol 50 MCG (2000 UT) TABS Take 1 tablet by mouth daily.     fexofenadine (ALLEGRA ALLERGY) 180 MG tablet      lamoTRIgine  (LAMICTAL ) 100 MG tablet TAKE 1 TABLET BY MOUTH EVERY DAY 90 tablet 0   Lysine 1000 MG TABS Take 2,000 mg by mouth 2 (two) times daily.     Menaquinone-7 (VITAMIN K2 PO) Take by mouth.     pantoprazole  (PROTONIX ) 40 MG tablet Take  1 tablet (40 mg total) by mouth daily. 30 tablet 0   potassium citrate  (UROCIT-K ) 10 MEQ (1080 MG) SR tablet TAKE 3 TABLETS BY MOUTH EVERY MORNING AND 2 TABLETS AT BEDTIME 450 tablet 3   simvastatin  (ZOCOR ) 10 MG tablet TAKE 1 TABLET(10 MG) BY MOUTH AT BEDTIME 90 tablet 3   tadalafil  (CIALIS ) 20 MG tablet Take by mouth.     topiramate  (TOPAMAX ) 100 MG tablet Take 1 tablet (100 mg total) by mouth daily. 90 tablet 3   Zinc 50 MG TABS Take by mouth.     No current facility-administered medications on file prior to visit.  BP 100/80   Pulse (!) 52   Temp 98.1 F (36.7 C) (Oral)   Ht 5' 7.15 (1.706 m)   Wt 151 lb (68.5 kg)   SpO2 96%   BMI 23.54 kg/m       Objective:   Physical Exam Vitals and nursing note reviewed.  Constitutional:      General: He is not in acute distress.    Appearance: Normal appearance. He is not ill-appearing.  HENT:     Head: Normocephalic and atraumatic.     Right Ear: Tympanic membrane, ear canal and external ear normal. There is no impacted cerumen.     Left Ear: Tympanic membrane, ear canal and external ear normal. There is no impacted cerumen.     Nose: Nose normal. No congestion or rhinorrhea.     Mouth/Throat:     Mouth: Mucous membranes are moist.     Pharynx: Oropharynx is clear.  Eyes:     Extraocular Movements: Extraocular movements intact.     Conjunctiva/sclera: Conjunctivae normal.     Pupils: Pupils are equal, round, and reactive to light.  Neck:     Vascular: No carotid bruit.  Cardiovascular:     Rate and Rhythm: Normal rate and regular rhythm.     Pulses: Normal pulses.     Heart sounds: No murmur heard.    No friction rub. No gallop.  Pulmonary:     Effort: Pulmonary effort is normal.     Breath sounds: Normal breath sounds.  Abdominal:     General: Abdomen is flat. Bowel sounds are normal. There is no distension.     Palpations: Abdomen is soft. There is no mass.     Tenderness: There is no abdominal tenderness. There is  no guarding or rebound.     Hernia: No hernia is present.  Musculoskeletal:        General: Tenderness present. No swelling or deformity. Normal range of motion.     Cervical back: Normal range of motion and neck supple.     Right lower leg: No edema.     Left lower leg: No edema.  Lymphadenopathy:     Cervical: No cervical adenopathy.  Skin:    General: Skin is warm and dry.     Capillary Refill: Capillary refill takes less than 2 seconds.  Neurological:     General: No focal deficit present.     Mental Status: He is alert and oriented to person, place, and time.  Psychiatric:        Mood and Affect: Mood normal.        Behavior: Behavior normal.        Thought Content: Thought content normal.        Judgment: Judgment normal.         Assessment & Plan:  1. Routine general medical examination at a health care facility ([facility]) Today patient counseled on age appropriate routine health concerns for screening and prevention, each reviewed and up to date or declined. Immunizations reviewed and up to date or declined. Labs ordered and reviewed. Risk factors for depression reviewed and negative. Hearing function and visual acuity are intact. ADLs screened and addressed as needed. Functional ability and level of safety reviewed and appropriate. Education, counseling and referrals performed based on assessed risks today. Patient provided with a copy of personalized plan for preventive services.   2. Mixed hyperlipidemia - likely need to be on statin  - Lipid panel; Future - TSH; Future - CBC;  Future - Comprehensive metabolic panel; Future - Hemoglobin A1c; Future  3. Bipolar disorder, in full remission, most recent episode mixed ([facility]) - [facility] - Lipid panel; Future - TSH; Future - CBC; Future - Comprehensive metabolic panel; Future  4. History of kidney stones - Continue current therapy  - Lipid panel; Future - TSH; Future - CBC; Future - Comprehensive metabolic panel;  Future  5. Hx of migraine headaches - Continue with Topamax   - Lipid panel; Future - TSH; Future - CBC; Future - Comprehensive metabolic panel; Future  6. Erectile dysfunction, unspecified erectile dysfunction type  - Lipid panel; Future - TSH; Future - CBC; Future - Comprehensive metabolic panel; Future  7. Prostate cancer screening  - PSA; Future  8. Chronic pain of right knee - Needs NEXUS letter for chronic bilateral knee pain as he feels as though his knee pain comes from flat feet. Will order xrays today.  - DG Knee 1-2 Views Right; Future  9. Chronic pain of left knee  - DG Knee 1-2 Views Left; Future  Darleene Shape, NP

## 2023-12-30 ENCOUNTER — Encounter: Payer: Self-pay | Admitting: Adult Health

## 2023-12-30 DIAGNOSIS — K2100 Gastro-esophageal reflux disease with esophagitis, without bleeding: Secondary | ICD-10-CM

## 2023-12-31 MED ORDER — TOPIRAMATE 100 MG PO TABS: 100.0000 mg | ORAL_TABLET | Freq: Every day | ORAL | 3 refills | Status: AC

## 2023-12-31 MED ORDER — CHLORTHALIDONE 25 MG PO TABS: ORAL_TABLET | ORAL | 3 refills | Status: AC

## 2023-12-31 MED ORDER — PANTOPRAZOLE SODIUM 40 MG PO TBEC: 40.0000 mg | DELAYED_RELEASE_TABLET | Freq: Every day | ORAL | 0 refills | Status: AC

## 2023-12-31 MED ORDER — LAMOTRIGINE 100 MG PO TABS: 100.0000 mg | ORAL_TABLET | Freq: Every day | ORAL | 0 refills | Status: DC

## 2024-01-04 ENCOUNTER — Other Ambulatory Visit: Payer: Self-pay

## 2024-01-04 MED ORDER — TADALAFIL 20 MG PO TABS: ORAL_TABLET | ORAL | 1 refills | Status: DC

## 2024-01-13 ENCOUNTER — Encounter: Payer: Self-pay | Admitting: Adult Health

## 2024-01-13 DIAGNOSIS — Z0279 Encounter for issue of other medical certificate: Secondary | ICD-10-CM

## 2024-03-27 ENCOUNTER — Other Ambulatory Visit: Payer: Self-pay | Admitting: Adult Health

## 2024-08-07 ENCOUNTER — Other Ambulatory Visit: Payer: Self-pay | Admitting: Adult Health

## 2024-11-02 ENCOUNTER — Other Ambulatory Visit: Payer: Self-pay | Admitting: Adult Health

## 2024-11-07 ENCOUNTER — Encounter: Payer: Self-pay | Admitting: Adult Health

## 2024-11-07 MED ORDER — LAMOTRIGINE 100 MG PO TABS: 100.0000 mg | ORAL_TABLET | Freq: Every day | ORAL | 0 refills | Status: DC

## 2024-11-07 MED ORDER — LAMOTRIGINE 100 MG PO TABS: 100.0000 mg | ORAL_TABLET | Freq: Every day | ORAL | 0 refills | Status: AC
